# Patient Record
Sex: Female | Born: 1985 | Race: Black or African American | Hispanic: No | Marital: Single | Smoking: Never smoker
Health system: Southern US, Community
[De-identification: ages and names within clinical notes are randomized; demographics above are authoritative.]

## PROBLEM LIST (undated history)

## (undated) DIAGNOSIS — O119 Pre-existing hypertension with pre-eclampsia, unspecified trimester: Secondary | ICD-10-CM

## (undated) DIAGNOSIS — I10 Essential (primary) hypertension: Secondary | ICD-10-CM

## (undated) DIAGNOSIS — O99820 Streptococcus B carrier state complicating pregnancy: Secondary | ICD-10-CM

## (undated) DIAGNOSIS — O1490 Unspecified pre-eclampsia, unspecified trimester: Secondary | ICD-10-CM

## (undated) DIAGNOSIS — O10919 Unspecified pre-existing hypertension complicating pregnancy, unspecified trimester: Secondary | ICD-10-CM

## (undated) HISTORY — DX: Pre-existing hypertension with pre-eclampsia, unspecified trimester: O11.9

## (undated) HISTORY — DX: Streptococcus B carrier state complicating pregnancy: O99.820

## (undated) HISTORY — DX: Unspecified pre-existing hypertension complicating pregnancy, unspecified trimester: O10.919

## (undated) HISTORY — DX: Unspecified pre-eclampsia, unspecified trimester: O14.90

## (undated) HISTORY — DX: Essential (primary) hypertension: I10

## (undated) HISTORY — PX: NO PAST SURGERIES: SHX2092

---

## 2014-04-29 ENCOUNTER — Encounter (HOSPITAL_COMMUNITY): Payer: Self-pay | Admitting: Emergency Medicine

## 2014-04-29 ENCOUNTER — Emergency Department (HOSPITAL_COMMUNITY)
Admission: EM | Admit: 2014-04-29 | Discharge: 2014-04-29 | Disposition: A | Discharge status: Home / Self Care | Payer: Self-pay | Attending: Emergency Medicine | Admitting: Emergency Medicine

## 2014-04-29 DIAGNOSIS — J039 Acute tonsillitis, unspecified: Secondary | ICD-10-CM | POA: Insufficient documentation

## 2014-04-29 DIAGNOSIS — J029 Acute pharyngitis, unspecified: Secondary | ICD-10-CM | POA: Insufficient documentation

## 2014-04-29 DIAGNOSIS — R000 Tachycardia, unspecified: Secondary | ICD-10-CM | POA: Insufficient documentation

## 2014-04-29 LAB — RAPID STREP SCREEN (MED CTR MEBANE ONLY): Streptococcus, Group A Screen (Direct): NEGATIVE

## 2014-04-29 MED ORDER — AMOXICILLIN-POT CLAVULANATE 875-125 MG PO TABS
1.0000 | ORAL_TABLET | Freq: Once | ORAL | Status: AC
  Administered 2014-04-29: 1 via ORAL
  Filled 2014-04-29: qty 1

## 2014-04-29 MED ORDER — AMOXICILLIN-POT CLAVULANATE 875-125 MG PO TABS: 1.0000 | ORAL_TABLET | Freq: Two times a day (BID) | ORAL | Status: DC

## 2014-04-29 MED ORDER — HYDROCODONE-ACETAMINOPHEN 5-325 MG PO TABS
1.0000 | ORAL_TABLET | Freq: Once | ORAL | Status: AC
  Administered 2014-04-29: 1 via ORAL
  Filled 2014-04-29: qty 1

## 2014-04-29 MED ORDER — IBUPROFEN 200 MG PO TABS
600.0000 mg | ORAL_TABLET | Freq: Once | ORAL | Status: AC
  Administered 2014-04-29: 600 mg via ORAL

## 2014-04-29 MED ORDER — HYDROCODONE-ACETAMINOPHEN 5-325 MG PO TABS: 1.0000 | ORAL_TABLET | ORAL | Status: DC | PRN

## 2014-04-29 MED ORDER — ACETAMINOPHEN 325 MG PO TABS
650.0000 mg | ORAL_TABLET | Freq: Four times a day (QID) | ORAL | Status: DC | PRN
  Administered 2014-04-29: 650 mg via ORAL
  Filled 2014-04-29: qty 2

## 2014-04-29 MED ORDER — DEXAMETHASONE 4 MG PO TABS
6.0000 mg | ORAL_TABLET | Freq: Once | ORAL | Status: AC
Start: 2014-04-29 — End: 2014-04-29
  Administered 2014-04-29: 6 mg via ORAL
  Filled 2014-04-29: qty 2

## 2014-04-29 NOTE — ED Provider Notes (Signed)
Medical screening examination/treatment/procedure(s) were performed by non-physician practitioner and as supervising physician I was immediately available for consultation/collaboration.   EKG Interpretation None        Tynisha Ogan, MD 04/29/14 2349 

## 2014-04-29 NOTE — ED Provider Notes (Signed)
CSN: [id number]     Arrival date & time 04/29/14  2124 History   None    Chief Complaint  Patient presents with  . Sore Throat    The patient said she has had throat pain for about two days.  She did say she has "white spots" in the back of her throat.  She cant eat, she cant swallow.       (Consider location/radiation/quality/duration/timing/severity/associated sxs/prior Treatment) HPI Comments: Patient with sore throat, and purulent exudate on tonsils.  For the past 2, days.  Also complains of difficulty swallowing.  Has taken over-the-counter Advil with minimal relief Denies any nausea, vomiting, headache, abdominal pain, dysuria, diarrhea  Patient is a 28 y.o. female presenting with pharyngitis. The history is provided by the patient.  Sore Throat This is a new problem. The current episode started in the past 7 days. The problem occurs constantly. The problem has been unchanged. Associated symptoms include a fever. Pertinent negatives include no headaches, joint swelling, myalgias or rash. The symptoms are aggravated by swallowing. Treatments tried: advil. The treatment provided mild relief.    History reviewed. No pertinent past medical history. History reviewed. No pertinent past surgical history. History reviewed. No pertinent family history. History  Substance Use Topics  . Smoking status: Never Smoker   . Smokeless tobacco: Never Used  . Alcohol Use: Yes     Comment: occ   OB History   Grav Para Term Preterm Abortions TAB SAB Ect Mult Living                 Review of Systems  Constitutional: Positive for fever.  HENT: Positive for trouble swallowing and voice change. Negative for ear discharge and sinus pressure.   Respiratory: Negative for shortness of breath.   Genitourinary: Negative for dysuria.  Musculoskeletal: Negative for joint swelling and myalgias.  Skin: Negative for rash.  Neurological: Negative for headaches.  All other systems reviewed and are  negative.     Allergies  Review of patient's allergies indicates no known allergies.  Home Medications   Prior to Admission medications   Medication Sig Start Date End Date Taking? Authorizing Provider  naproxen sodium (ANAPROX) 220 MG tablet Take 220 mg by mouth daily as needed. For pain   Yes Historical Provider, MD   BP 128/68  Pulse 118  Temp(Src) 101 F (38.3 C) (Oral)  Resp 18  SpO2 100%  LMP 04/23/2014 Physical Exam  Nursing note and vitals reviewed. Constitutional: She appears well-developed and well-nourished.  HENT:  Head: Normocephalic.  Right Ear: External ear normal.  Left Ear: External ear normal.  Mouth/Throat: Uvula is midline. No uvula swelling. No tonsillar abscesses.  Exudate on tonsils, bilateral  Neck: Normal range of motion.  Cardiovascular: Regular rhythm.  Tachycardia present.   Pulmonary/Chest: Effort normal and breath sounds normal.  Abdominal: Soft.  Musculoskeletal: Normal range of motion.  Lymphadenopathy:    She has cervical adenopathy.  Neurological: She is alert.  Skin: Skin is warm. No rash noted.    ED Course  Procedures (including critical care time) Labs Review Labs Reviewed  RAPID STREP SCREEN  CULTURE, GROUP A STREP    Imaging Review No results found.   EKG Interpretation None      MDM   Final diagnoses:  None     Will treat with Augmentin and Vicodan also given a dose of Decadron in the emergency department for swelling.  She's been instructed to take the medication as directed until, completed.  Follow up with Dr. Pollyann Kennedy, ENT, if, needed.  She's also been given a resource list to help her find a primary care physician    Arman Filter, NP 04/29/14 4098  Arman Filter, NP 04/29/14 [phone]

## 2014-04-29 NOTE — Discharge Instructions (Signed)
Your strep test is negative.  You have been started on Augmentin.  Please take this as directed until all tablets have been consumed.  You've also been given Vicodin for pain.  Please do not take Tylenol along with the Vicodin as it already contains Tylenol, you can safely take Advil or ibuprofen in conjunction with the Vicodin.  You've also been given a dose of Decadron, which is a long acting steroid, which will help with swelling.  It also been given a referral to ENT.  Your sore throat.  Does not improve significantly in the last next 7-10 days    Emergency Department Resource Guide 1) Find a Doctor and Pay Out of Pocket Although you won't have to find out who is covered by your insurance plan, it is a good idea to ask around and get recommendations. You will then need to call the office and see if the doctor you have chosen will accept you as a new patient and what types of options they offer for patients who are self-pay. Some doctors offer discounts or will set up payment plans for their patients who do not have insurance, but you will need to ask so you aren't surprised when you get to your appointment.  2) Contact Your Local Health Department Not all health departments have doctors that can see patients for sick visits, but many do, so it is worth a call to see if yours does. If you don't know where your local health department is, you can check in your phone book. The CDC also has a tool to help you locate your state's health department, and many state websites also have listings of all of their local health departments.  3) Find a Walk-in Clinic If your illness is not likely to be very severe or complicated, you may want to try a walk in clinic. These are popping up all over the country in pharmacies, drugstores, and shopping centers. They're usually staffed by nurse practitioners or physician assistants that have been trained to treat common illnesses and complaints. They're usually fairly  quick and inexpensive. However, if you have serious medical issues or chronic medical problems, these are probably not your best option.  No Primary Care Doctor: - Call Health Connect at  [phone] - they can help you locate a primary care doctor that  accepts your insurance, provides certain services, etc. - Physician Referral Service- [phone]  Chronic Pain Problems: Organization         Address  Phone   Notes  [facility]  [phone] Patients need to be referred by their primary care doctor.   Medication Assistance: Organization         Address  Phone   Notes  [facility] Medication [facility] [address] [place] [place], [place] [address] [phone] --Must be a resident of [facility] -- Must have NO insurance coverage whatsoever (no Medicaid/ Medicare, etc.) -- The pt. MUST have a primary care doctor that directs their care regularly and follows them in the community   Med[facility]ist  [phone]   [employer]  [phone]    Agencies that provide inexpensive medical care: Organization         Address  Phone   Notes  [facility]  [phone]   [facility]    [phone]   [facility] [address] [place], [place] [address] [phone]   [facility] [address], [place] [phone]   Planned Parenthood    [phone]   [facility]    [phone]   [facility]  [address], [place] Phone:  [phone], Fax:  [phone] Hours of Operation:  9 am - 6 pm, M-F.  Also accepts Medicaid/Medicare and self-pay.  [facility]  [address], [place] Phone: [phone], Fax: [phone]. Hours of Operation:  8:30 am - 5:30 pm, M-F.  Also accepts Medicaid and self-pay.  [facility] [place] [address] [place] Phone: [phone]    [facility] [address] Natasha Bence [place], [place] [phone], Ext. [phone] Mondays & Thursdays: 7-9 AM.  First 15 patients are seen on a first come, first serve basis.    Medicaid-accepting [facility] Providers:  Organization         Address  Phone   Notes  [facility] [address], [place] [phone] Also accepts self-pay patients.  [facility] [address] Laurell Josephs [place], [place]  [phone]   [facility] [address], [place] [phone]   [facility] [address] [place] [phone]   Renaye Rakers [address], [place]   [phone] Only accepts [place] Access [place] patients after they have their name applied to their card.   Self-Pay (no insurance) in [facility]:  Organization         Address  Phone   Notes  Sickle Cell Patients, [facility] [place] [facility] [address] [place], [place] [phone]   [facility] [address] [place], [place] [phone]   [facility] [place]  [address], [place] [phone]   [facility]/Dr. Osei-Bonsu  [address], [place] or [address], [place] [phone] Phone number for both [place] and [place] locations is the same.  [facility] [address] [place] [phone]   [facility] [address] [place] or [address] [phone]   [facility] [address] [place] [phone], phone; [phone], fax Sees patients 1st and 3rd Saturday of every month.  Must not qualify for public or private insurance (i.e. Medicaid, Medicare, [place] Health Choice, Veterans' Benefits)  Household income should be no more than 200% of the poverty level The clinic cannot treat you if you are  pregnant or think you are pregnant  Sexually transmitted diseases are not treated at the clinic.    Dental Care: Organization         Address  Phone  Notes  [facility] Department of [facility] [address] [place], [place] [phone] Accepts children up to age 64 who are enrolled in [place] or [place] Health Choice; pregnant women with a Medicaid card; and children who have applied for Medicaid or [place] Health Choice, but were declined, whose parents can pay a reduced fee at time of service.  [facility] of [facility]  [address], [place] [phone] Accepts children up to age 72 who are enrolled in [place] or  Health Choice; pregnant women with a Medicaid card; and children who  have applied for Medicaid or [place] Health Choice, but were declined, whose parents can pay a reduced fee at time of service.  [facility]  [address] [place], [place] [phone] Patients are seen by appointment only. Walk-ins are not accepted. [facility] will see patients 62 years of age and older. Monday - Tuesday (8am-5pm) Most Wednesdays (8:30-5pm) $30 per visit, cash only  [facility] Adult Dental Access PRO[facility]  [address], [facility] [phone] Patients are seen by appointment only. Walk-ins are not accepted. [facility] will see patients 80 years of age and older. One Wednesday Evening (Monthly: Volunteer Based).  $30 per visit, cash only  [employer]  [phone] for adults; Children under age 45, call Graduate Pediatric Dentistry at [phone]. Children aged 41-14, please call [phone] to request a pediatric application.  Dental services are provided in all areas of dental care including fillings, crowns and bridges, complete and partial dentures, implants, gum treatment, root canals, and extractions. Preventive care is also provided. Treatment is provided to  both adults and children. Patients are selected via a lottery and there is often a waiting list.   [facility] [address], [place]  [phone] [email or web address]   [facility] [address] [place], [place] [phone], Ext. [phone] Second and Fourth Thursday of each month, opens at 6:30 AM; Clinic ends at 9 AM.  Patients are seen on a first-come first-served basis, and a limited number are seen during each clinic.   [facility]  [address] Ether Griffins [place], [place] [phone]   Eligibility Requirements You must have lived in [place], [place], or [place] counties for at least the last three months.   You cannot be eligible for state or federal sponsored [place], including [employer], [place], or [employer].   You generally cannot be eligible for healthcare insurance through your employer.    How to apply: Eligibility screenings are held every Tuesday and Wednesday afternoon from 1:00 pm until 4:00 pm. You do not need an appointment for the interview!  [facility] [address] [place], [place] [phone]   [facility]  [phone]   [facility] Health Department  [phone]   [facility] Health Department  [phone]    Behavioral Health Resources in the Community: Intensive Outpatient Programs Organization         Address  Phone  Notes  [facility] [address], [place], [place] [phone]   [facility] [address] [place], [place] [phone]   [facility] [address] [place], [place]  [phone]   [facility] Mental Health [address],  [place], [place] [phone] or [phone]   Substance Abuse Resources Organization         Address  Phone  Notes  [facility] Drug Services  [phone]   [facility]  [phone]   [facility] [place]   [phone]   Floydene Flock  [phone]   Residential & Outpatient Substance Abuse Program  [phone]   Psychological Services Organization         Address  Phone  Notes  [facility] Health  [phone]   [facility] Services  [phone]   [facility] Mental Health [address] [place] [phone] or [phone]    Mobile Crisis Teams Organization  Address  Phone  Notes  [facility], Mobile Crisis Care Unit  [phone]   [facility]  [address] [place], [place] [phone]   [facility] [address] [place] [phone]    Self-Help/Support Groups Organization         Address  Phone             Notes  [facility] - variety of support groups  336- I7437963 Call for more information  Narcotics Anonymous (NA), [facility] [address], [employer] [place]  2 meetings at this location   Statistician         Address  Phone  Notes  [facility] [address],    [place]  [phone]   [facility]  [address], [place] [address], [place], [place] [phone]   [facility] [address] [place], [place] [phone] Admissions: 8am-3pm M-F  [facility] [address],    [place], [place] [phone]   [facility] [address] [place], [place], [place] [phone]   [facility] [address]  [place], [place] [phone]   [facility] - Intensive Outpatient [address]ure[address], [place], [place] [phone]   [facility] ([facility] [address] [place]  [place], [place] [phone] or [phone]   [facility] ([facility]) [address], [place], [place] [phone] Accepts Medicaid  [facility] [address]  [place] [phone] Substance Abuse/Addiction Treatment   [facility] Organization         Address  Phone  Notes  [facility]  [phone]   Angie Fava, PhD [address] Ervin Knack [place], [place]   [phone] or [phone]   [facility]   [address] [place], [place] [phone]   [facility] [address] [place], [place] [phone] Insurance/Medicaid/sponsorship through [facility] [address]                                    [place], [place] [phone] Therapy/tele-psych/case  [facility] [address], [place] [phone]    Dr. Lolly Mustache  [phone]   [facility]  [employer] [facility] Dept. 1) [address], [place] 2) [address] [place] 3)  [address], [place] [phone]  [phone]   [facility] Child Abuse Hotline (612)[phone] or [phone] (After Hours)

## 2014-04-29 NOTE — ED Notes (Signed)
The patient said she has had throat pain for about two days.  She did say she has "white spots" in the back of her throat.  She cant eat, she cant swallow.  The patient rates her pain 10/10.

## 2014-05-01 LAB — CULTURE, GROUP A STREP

## 2016-03-21 ENCOUNTER — Encounter (HOSPITAL_COMMUNITY): Payer: Self-pay | Admitting: Emergency Medicine

## 2016-03-21 ENCOUNTER — Emergency Department (HOSPITAL_COMMUNITY)
Admission: EM | Admit: 2016-03-21 | Discharge: 2016-03-21 | Disposition: A | Discharge status: Home / Self Care | Payer: Self-pay | Attending: Emergency Medicine | Admitting: Emergency Medicine

## 2016-03-21 DIAGNOSIS — Z792 Long term (current) use of antibiotics: Secondary | ICD-10-CM | POA: Insufficient documentation

## 2016-03-21 DIAGNOSIS — K029 Dental caries, unspecified: Secondary | ICD-10-CM | POA: Insufficient documentation

## 2016-03-21 DIAGNOSIS — K0889 Other specified disorders of teeth and supporting structures: Secondary | ICD-10-CM | POA: Insufficient documentation

## 2016-03-21 MED ORDER — BUPIVACAINE-EPINEPHRINE (PF) 0.5% -1:200000 IJ SOLN
1.8000 mL | Freq: Once | INTRAMUSCULAR | Status: AC
  Administered 2016-03-21: 1.8 mL
  Filled 2016-03-21: qty 1.8

## 2016-03-21 MED ORDER — PENICILLIN V POTASSIUM 500 MG PO TABS: 500.0000 mg | ORAL_TABLET | Freq: Four times a day (QID) | ORAL | Status: AC

## 2016-03-21 MED ORDER — KETOROLAC TROMETHAMINE 60 MG/2ML IM SOLN
60.0000 mg | Freq: Once | INTRAMUSCULAR | Status: AC
  Administered 2016-03-21: 60 mg via INTRAMUSCULAR
  Filled 2016-03-21: qty 2

## 2016-03-21 MED ORDER — NAPROXEN 500 MG PO TABS: 500.0000 mg | ORAL_TABLET | Freq: Two times a day (BID) | ORAL | Status: DC

## 2016-03-21 MED ORDER — LIDOCAINE VISCOUS 2 % MT SOLN: 15.0000 mL | OROMUCOSAL | Status: DC | PRN

## 2016-03-21 MED ORDER — OXYCODONE-ACETAMINOPHEN 5-325 MG PO TABS
1.0000 | ORAL_TABLET | Freq: Once | ORAL | Status: AC
  Administered 2016-03-21: 1 via ORAL
  Filled 2016-03-21: qty 1

## 2016-03-21 NOTE — ED Notes (Signed)
Pt given discharge instructions, verbalized understanding of need to follow up with the dentist, reasons to return to the ED and medications to take at home. Pt denied questions or further concerns. Pt able to ambulate to exit where family member was waiting. Pt stated pain decreased to 4/10.

## 2016-03-21 NOTE — ED Provider Notes (Signed)
CSN: [id number]     Arrival date & time 03/21/16  1620 History  By signing my name below, I, Laura Mcneil, attest that this documentation has been prepared under the direction and in the presence of Tasmin Exantus, PA-C.Marland Kitchen Electronically Signed: Bridgette Mcneil, ED Scribe. 03/21/2016. 6:23 PM.   Chief Complaint  Patient presents with  . Dental Pain   The history is provided by the patient. No language interpreter was used.   HPI Comments: Laura Mcneil is a 30 y.o. female who presents to the Emergency Department complaining of sudden onset, constant, moderate lower right dental pain onset 2 days ago. No drainage present. Pt notes some swelling present in her gums and face onset one day ago with difficulty chewing. Pt also has intermittent headache secondary to the pain. Pt has not been able to see a dentist yet. Pt denies nausea, vomiting, fever/chills, difficulty breathing or swallowing, or any other complaints.   History reviewed. No pertinent past medical history. History reviewed. No pertinent past surgical history. No family history on file. Social History  Substance Use Topics  . Smoking status: Never Smoker   . Smokeless tobacco: Never Used  . Alcohol Use: Yes     Comment: occ   OB History    No data available     Review of Systems  Constitutional: Negative for fever.  HENT: Positive for dental problem and facial swelling.   Gastrointestinal: Negative for nausea, vomiting and diarrhea.  Neurological: Positive for headaches. Negative for dizziness and numbness.    Allergies  Review of patient's allergies indicates no known allergies.  Home Medications   Prior to Admission medications   Medication Sig Start Date End Date Taking? Authorizing Provider  amoxicillin-clavulanate (AUGMENTIN) 875-125 MG per tablet Take 1 tablet by mouth 2 (two) times daily. 04/29/14   Earley Favor, NP  HYDROcodone-acetaminophen (NORCO/VICODIN) 5-325 MG per tablet Take 1 tablet by mouth every 4 (four) hours as  needed for moderate pain. 04/29/14   Earley Favor, NP  lidocaine (XYLOCAINE) 2 % solution Use as directed 15 mLs in the mouth or throat as needed for mouth pain. 03/21/16   Trea Latner C Hernando Reali, PA-C  naproxen (NAPROSYN) 500 MG tablet Take 1 tablet (500 mg total) by mouth 2 (two) times daily. 03/21/16   Corday Wyka C Breanda Greenlaw, PA-C  naproxen sodium (ANAPROX) 220 MG tablet Take 220 mg by mouth daily as needed. For pain    Historical Provider, MD  penicillin v potassium (VEETID) 500 MG tablet Take 1 tablet (500 mg total) by mouth 4 (four) times daily. 03/21/16 03/28/16  Maygan Koeller C Delayna Sparlin, PA-C   BP 172/104 mmHg  Pulse 86  Temp(Src) 98.7 F (37.1 C) (Oral)  Resp 18  Ht  (1.803 m)  Wt 117.935 kg  BMI 36.28 kg/m2  SpO2 98%  LMP 03/12/2016 Physical Exam  Constitutional: She appears well-developed and well-nourished.  HENT:  Head: Normocephalic.  Minor facial swelling to right lower jaw. Dental carries with a partially fractured tooth in the second right mandibular premolar. Swelling to the gingival surface without fluctuance. No hemorrhage or exudate.  Eyes: Conjunctivae are normal.  Neck: Normal range of motion.  No swelling to the soft tissues of the neck.  Cardiovascular: Normal rate.   Pulmonary/Chest: Effort normal. No respiratory distress.  Neurological: She is alert.  Skin: Skin is warm and dry.  Psychiatric: She has a normal mood and affect. Her behavior is normal.  Nursing note and vitals reviewed.   ED Course  .Nerve  Block Date/Time: 03/21/2016 6:45 PM Performed by: Anselm PancoastJOY, Niva Murren C Authorized by: Anselm PancoastJOY, Johnpatrick Jenny C Consent: Verbal consent obtained. Risks and benefits: risks, benefits and alternatives were discussed Consent given by: patient Patient understanding: patient states understanding of the procedure being performed Patient consent: the patient's understanding of the procedure matches consent given Procedure consent: procedure consent matches procedure scheduled Patient identity confirmed:  verbally with patient and arm band Indications: pain relief Body area: face/mouth Nerve: inferior alveolar Laterality: right Patient sedated: no Patient position: sitting Needle gauge: 27 G Location technique: anatomical landmarks Local anesthetic: bupivacaine 0.5% with epinephrine Anesthetic total: 1.8 ml Outcome: pain improved Patient tolerance: Patient tolerated the procedure well with no immediate complications    DIAGNOSTIC STUDIES: Oxygen Saturation is 100% on RA, normal by my interpretation.    COORDINATION OF CARE: 6:23 PM Discussed treatment plan with pt at bedside which includes antibiotics and pain Rx and dentist follow-up and pt agreed to plan.  MDM   Final diagnoses:  Pain due to dental caries    Donavan FoilAnitra Hammonds presents with dental pain for the past two days.    Pt with dental pain, likely from dental caries. Exam not concerning for ludwig's angioedema. No signs of sepsis. Pt to follow up with dentist. The patient was given instructions for home care as well as return precautions. Patient voices understanding of these instructions, accepts the plan, and is comfortable with discharge.  Filed Vitals:   03/21/16 1701 03/21/16 1723 03/21/16 1934  BP: 172/104    Pulse: 82  86  Temp: 98.7 F (37.1 C)    TempSrc: Oral    Resp: 18  18  Height: 5\' 11"  (1.803 m)    Weight: 117.935 kg    SpO2: 100% 100% 98%     I personally performed the services described in this documentation, which was scribed in my presence. The recorded information has been reviewed and is accurate.   Anselm PancoastShawn C Glover Capano, PA-C 03/22/16 1535   Cathren LaineKevin Steinl, MD 03/27/16 1311

## 2016-03-21 NOTE — Discharge Instructions (Signed)
You have been seen today for dental pain. Follow up with a dentist as soon as possible. Please take all of your antibiotics until finished!   You may develop abdominal discomfort or diarrhea from the antibiotic.  You may help offset this with probiotics which you can buy or get in yogurt. Do not eat or take the probiotics until 2 hours after your antibiotic. Return to ED as needed. Naproxen or ibuprofen for pain. We are unable to prescribe narcotic pain medications for dental pain.

## 2016-03-21 NOTE — ED Notes (Signed)
Per patient, she has lower right dental pain.  Some swelling in her gums.  She is having difficulty chewing. Denies n/v/d; however, she is beginning to have a headache from the pain.

## 2016-09-03 NOTE — L&D Delivery Note (Signed)
Delivery Note At 3:32 AM a viable female was delivered via  (Presentation: ROA).  APGAR:8 ,9 ; weight: pending  .   Placenta status: Spontaneous and Intact.  Cord: 3 vessels  with the following complications: none.  Cord pH: n/a  Anesthesia:  Epidural  Episiotomy: None Lacerations: Periurethral and Left labial Suture Repair: none, lacerations found to have hemostasis Est. Blood Loss (mL):  100ml   Baby was delivered via vaginally without complications. Baby presented as ROA and was immediately placed on mothers chest. Cord clamping was then delayed and clamped x2. FOB cut cord. Umbilical blood samples were taken. Cord had 3 vessels. Placenta was then delivered spontaneously with traction and was intact. Placenta was sent to pathology. Baby was taken to NICU.   Mom to postpartum.  Baby to NICU.  Tyeler Goedken 08/15/2017, 3:50 AM

## 2017-02-04 ENCOUNTER — Encounter (HOSPITAL_COMMUNITY): Payer: Self-pay | Admitting: Emergency Medicine

## 2017-02-04 ENCOUNTER — Emergency Department (HOSPITAL_COMMUNITY)
Admission: EM | Admit: 2017-02-04 | Discharge: 2017-02-04 | Disposition: A | Discharge status: Home / Self Care | Payer: Medicaid Other | Attending: Emergency Medicine | Admitting: Emergency Medicine

## 2017-02-04 DIAGNOSIS — O10011 Pre-existing essential hypertension complicating pregnancy, first trimester: Secondary | ICD-10-CM | POA: Insufficient documentation

## 2017-02-04 DIAGNOSIS — O0281 Inappropriate change in quantitative human chorionic gonadotropin (hCG) in early pregnancy: Secondary | ICD-10-CM | POA: Diagnosis not present

## 2017-02-04 DIAGNOSIS — Z79899 Other long term (current) drug therapy: Secondary | ICD-10-CM | POA: Diagnosis not present

## 2017-02-04 DIAGNOSIS — Z3A01 Less than 8 weeks gestation of pregnancy: Secondary | ICD-10-CM | POA: Diagnosis not present

## 2017-02-04 DIAGNOSIS — O9989 Other specified diseases and conditions complicating pregnancy, childbirth and the puerperium: Secondary | ICD-10-CM | POA: Diagnosis present

## 2017-02-04 DIAGNOSIS — O161 Unspecified maternal hypertension, first trimester: Secondary | ICD-10-CM

## 2017-02-04 DIAGNOSIS — O99351 Diseases of the nervous system complicating pregnancy, first trimester: Secondary | ICD-10-CM | POA: Diagnosis not present

## 2017-02-04 DIAGNOSIS — G43809 Other migraine, not intractable, without status migrainosus: Secondary | ICD-10-CM

## 2017-02-04 LAB — CBC WITH DIFFERENTIAL/PLATELET
BASOS ABS: 0 10*3/uL (ref 0.0–0.1)
BASOS PCT: 0 %
Eosinophils Absolute: 0 10*3/uL (ref 0.0–0.7)
Eosinophils Relative: 0 %
HCT: 30.2 % — ABNORMAL LOW (ref 36.0–46.0)
HEMOGLOBIN: 9.7 g/dL — AB (ref 12.0–15.0)
Lymphocytes Relative: 35 %
Lymphs Abs: 1.7 10*3/uL (ref 0.7–4.0)
MCH: 24.3 pg — ABNORMAL LOW (ref 26.0–34.0)
MCHC: 32.1 g/dL (ref 30.0–36.0)
MCV: 75.7 fL — ABNORMAL LOW (ref 78.0–100.0)
Monocytes Absolute: 0.4 10*3/uL (ref 0.1–1.0)
Monocytes Relative: 7 %
NEUTROS ABS: 2.9 10*3/uL (ref 1.7–7.7)
NEUTROS PCT: 58 %
Platelets: 226 10*3/uL (ref 150–400)
RBC: 3.99 MIL/uL (ref 3.87–5.11)
RDW: 16.6 % — ABNORMAL HIGH (ref 11.5–15.5)
WBC: 5 10*3/uL (ref 4.0–10.5)

## 2017-02-04 LAB — BASIC METABOLIC PANEL
ANION GAP: 8 (ref 5–15)
BUN: 9 mg/dL (ref 6–20)
CALCIUM: 9.3 mg/dL (ref 8.9–10.3)
CHLORIDE: 106 mmol/L (ref 101–111)
CO2: 23 mmol/L (ref 22–32)
Creatinine, Ser: 0.86 mg/dL (ref 0.44–1.00)
GFR calc non Af Amer: >60 mL/min (ref >60)
Glucose, Bld: 87 mg/dL (ref 65–99)
Potassium: 3.5 mmol/L (ref 3.5–5.1)
Sodium: 137 mmol/L (ref 135–145)

## 2017-02-04 LAB — HEPATIC FUNCTION PANEL
ALT: 10 U/L — ABNORMAL LOW (ref 14–54)
AST: 13 U/L — AB (ref 15–41)
Albumin: 4.3 g/dL (ref 3.5–5.0)
Alkaline Phosphatase: 41 U/L (ref 38–126)
Bilirubin, Direct: <0.1 mg/dL — ABNORMAL LOW (ref 0.1–0.5)
TOTAL PROTEIN: 8.1 g/dL (ref 6.5–8.1)
Total Bilirubin: 0.4 mg/dL (ref 0.3–1.2)

## 2017-02-04 LAB — HCG, QUANTITATIVE, PREGNANCY: hCG, Beta Chain, Quant, S: 84715 m[IU]/mL — ABNORMAL HIGH (ref <5)

## 2017-02-04 LAB — I-STAT BETA HCG BLOOD, ED (MC, WL, AP ONLY): I-stat hCG, quantitative: >2000 m[IU]/mL — ABNORMAL HIGH (ref <5)

## 2017-02-04 LAB — ABO/RH: ABO/RH(D): B POS

## 2017-02-04 MED ORDER — SODIUM CHLORIDE 0.9 % IV BOLUS (SEPSIS)
1000.0000 mL | Freq: Once | INTRAVENOUS | Status: AC
  Administered 2017-02-04: 1000 mL via INTRAVENOUS

## 2017-02-04 MED ORDER — ACETAMINOPHEN 500 MG PO TABS
1000.0000 mg | ORAL_TABLET | Freq: Once | ORAL | Status: AC
  Administered 2017-02-04: 1000 mg via ORAL
  Filled 2017-02-04: qty 2

## 2017-02-04 MED ORDER — KETOROLAC TROMETHAMINE 30 MG/ML IJ SOLN
30.0000 mg | Freq: Once | INTRAMUSCULAR | Status: DC
  Filled 2017-02-04: qty 1

## 2017-02-04 MED ORDER — LABETALOL HCL 100 MG PO TABS
100.0000 mg | ORAL_TABLET | Freq: Once | ORAL | Status: AC
  Administered 2017-02-04: 100 mg via ORAL
  Filled 2017-02-04: qty 1

## 2017-02-04 MED ORDER — PRENATAL COMPLETE 14-0.4 MG PO TABS: 1.0000 | ORAL_TABLET | Freq: Every day | ORAL | 0 refills | Status: DC

## 2017-02-04 MED ORDER — FERROUS SULFATE 325 (65 FE) MG PO TABS: 325.0000 mg | ORAL_TABLET | Freq: Every day | ORAL | 0 refills | Status: DC

## 2017-02-04 MED ORDER — LABETALOL HCL 100 MG PO TABS: 100.0000 mg | ORAL_TABLET | Freq: Two times a day (BID) | ORAL | 0 refills | Status: DC

## 2017-02-04 MED ORDER — DIPHENHYDRAMINE HCL 50 MG/ML IJ SOLN
25.0000 mg | Freq: Once | INTRAMUSCULAR | Status: DC
  Filled 2017-02-04: qty 1

## 2017-02-04 MED ORDER — PROCHLORPERAZINE EDISYLATE 5 MG/ML IJ SOLN
10.0000 mg | Freq: Once | INTRAMUSCULAR | Status: DC
  Filled 2017-02-04: qty 2

## 2017-02-04 NOTE — ED Triage Notes (Signed)
Per pt, states she has had a headache for 3 days-no history and has not f/u with anyone

## 2017-02-04 NOTE — ED Notes (Signed)
Pt A&Ox4 and ambulatory independent w/steady gait.  Given info about managing htn in pregnancy.  Denies any further questions.

## 2017-02-04 NOTE — Discharge Instructions (Signed)
Take your medication as prescribed for your elevated blood pressure. As recommended taking your iron supplement and prenatal vitamins as prescribed. You may take Tylenol as prescribed over-the-counter as needed for your headache. Call the OB/GYN clinic listed below tomorrow to schedule a follow-up appointment for reevaluation and further management of your pregnancy and elevated blood pressure. Please return to the Emergency Department if symptoms worsen or new onset of fever, new/worsening headache, visual changes, dizziness, chest pain, difficulty breathing, abdominal pain, vomiting, vaginal bleeding/discharge.

## 2017-02-04 NOTE — ED Provider Notes (Signed)
[facility]-EMERGENCY DEPT Provider Note   CSN: [id number] Arrival date & time: 02/04/17  1727     History   Chief Complaint Chief Complaint  Patient presents with  . Headache    HPI Laura Mcneil is a 31 y.o. female.  HPI   Patient is a 31 year old female with no pertinent history who presents to the ED with complaint of headache, onset 3 days. Patient reports she has had gradually worsening waxing and waning frontal headache for the past 3 days. Endorses associated intermittent dizziness, light sensitivity, nausea. Reports having similar migraines in the past but notes she has not had a migraine over the past few years. Reports taking Excedrin at home with mild intermittent relief. Denies any recent fall or head injury. Pt denies fever, neck stiffness, visual changes, CP, SOB, abdominal pain, N/V, urinary symptoms, numbness, tingling, weakness, seizures, syncope. rash.    Patient also reports skipping her menstrual cycle last month and is requesting pregnancy test. Denies fever, abdominal pain, nausea, vomiting, vaginal bleeding or discharge. LMP 12/12/15.  History reviewed. No pertinent past medical history.  There are no active problems to display for this patient.   History reviewed. No pertinent surgical history.  OB History    No data available       Home Medications    Prior to Admission medications   Medication Sig Start Date End Date Taking? Authorizing Provider  Diphenhydramine-APAP, sleep, (EXCEDRIN PM) 38-500 MG TABS Take 2 tablets by mouth daily as needed.   Yes [provider]  ferrous sulfate 325 (65 FE) MG tablet Take 1 tablet (325 mg total) by mouth daily. 02/04/17   Barrett Henle, PA-C  labetalol (NORMODYNE) 100 MG tablet Take 1 tablet (100 mg total) by mouth 2 (two) times daily. 02/04/17   Barrett Henle, PA-C  Prenatal Vit-Fe Fumarate-FA (PRENATAL COMPLETE) 14-0.4 MG TABS Take 1 tablet by mouth daily. 02/04/17   Barrett Henle,  PA-C    Family History No family history on file.  Social History Social History  Substance Use Topics  . Smoking status: Never Smoker  . Smokeless tobacco: Never Used  . Alcohol use Yes     Comment: occ     Allergies   Patient has no known allergies.   Review of Systems Review of Systems  Eyes: Positive for photophobia.  Gastrointestinal: Positive for nausea.  Neurological: Positive for dizziness and headaches.  All other systems reviewed and are negative.    Physical Exam Updated Vital Signs BP (!) 161/98   Pulse 67   Temp 98.4 F (36.9 C) (Oral)   Resp 18   Ht 5\' 11"  (1.803 m)   Wt 117.9 kg (260 lb)   LMP 12/31/2016   SpO2 100%   BMI 36.26 kg/m   Physical Exam  Constitutional: She is oriented to person, place, and time. She appears well-developed and well-nourished. No distress.  HENT:  Head: Normocephalic and atraumatic.  Right Ear: Tympanic membrane normal.  Left Ear: Tympanic membrane normal.  Nose: Nose normal. Right sinus exhibits no maxillary sinus tenderness and no frontal sinus tenderness. Left sinus exhibits no maxillary sinus tenderness and no frontal sinus tenderness.  Mouth/Throat: Uvula is midline, oropharynx is clear and moist and mucous membranes are normal. No oropharyngeal exudate, posterior oropharyngeal edema, posterior oropharyngeal erythema or tonsillar abscesses. No tonsillar exudate.  Eyes: Conjunctivae and EOM are normal. Pupils are equal, round, and reactive to light. Right eye exhibits no discharge. Left eye exhibits no discharge. No scleral  icterus.  No nystagmus  Neck: Normal range of motion and full passive range of motion without pain. Neck supple. No spinous process tenderness and no muscular tenderness present. No neck rigidity. No edema, no erythema and normal range of motion present.  Cardiovascular: Normal rate, regular rhythm, normal heart sounds and intact distal pulses.   Pulmonary/Chest: Effort normal and breath sounds  normal. No respiratory distress. She has no wheezes. She has no rales. She exhibits no tenderness.  Abdominal: Soft. Bowel sounds are normal. She exhibits no distension and no mass. There is no tenderness. There is no rebound and no guarding.  Musculoskeletal: Normal range of motion. She exhibits no edema or tenderness.  Lymphadenopathy:    She has no cervical adenopathy.  Neurological: She is alert and oriented to person, place, and time. She has normal strength. No cranial nerve deficit or sensory deficit. She displays a negative Romberg sign. Coordination and gait normal.  Skin: Skin is warm and dry. She is not diaphoretic.  Nursing note and vitals reviewed.    ED Treatments / Results  Labs (all labs ordered are listed, but only abnormal results are displayed) Labs Reviewed  CBC WITH DIFFERENTIAL/PLATELET - Abnormal; Notable for the following:       Result Value   Hemoglobin 9.7 (*)    HCT 30.2 (*)    MCV 75.7 (*)    MCH 24.3 (*)    RDW 16.6 (*)    All other components within normal limits  HEPATIC FUNCTION PANEL - Abnormal; Notable for the following:    AST 13 (*)    ALT 10 (*)    Bilirubin, Direct <0.1 (*)    All other components within normal limits  HCG, QUANTITATIVE, PREGNANCY - Abnormal; Notable for the following:    hCG, Beta Chain, Quant, S [id number] (*)    All other components within normal limits  I-STAT BETA HCG BLOOD, ED ([facility], [facility], [facility] ONLY) - Abnormal; Notable for the following:    I-stat hCG, quantitative >2,000.0 (*)    All other components within normal limits  BASIC METABOLIC PANEL  ABO/RH    EKG  EKG Interpretation None       Radiology No results found.  Procedures Procedures (including critical care time)  Medications Ordered in ED Medications  sodium chloride 0.9 % bolus 1,000 mL (0 mLs Intravenous Stopped 02/04/17 2219)  acetaminophen (TYLENOL) tablet 1,000 mg (1,000 mg Oral Given 02/04/17 1942)  labetalol (NORMODYNE) tablet 100 mg (100 mg Oral  Given 02/04/17 2218)     Initial Impression / Assessment and Plan / ED Course  I have reviewed the triage vital signs and the nursing notes.  Pertinent labs & imaging results that were available during my care of the patient were reviewed by me and considered in my medical decision making (see chart for details).    Patient presents with gradual onset waxing and waning headache that has been present for the past 3 days. She also reports missing her menstrual cycle last month and is requesting a pregnancy test. Denies fever, abdominal pain, nausea, vomiting, vaginal bleeding or discharge. Initial vitals showed BP 162/99. Exam unremarkable. No neuro deficits. Abdomen soft and nontender. Positive pregnancy test. Hgb 9.7. On reevaluation pt denies abdominal pain, vaginal bleeding or d/c. G1P0A0. Pt given Tylenol in the ED.   On reevaluation patient reports resolution of headache. Labs showed hemoglobin 9.7, MCV 75. Normal LFTs. HCG 69,629. Discussed pt with Dr. Clarene Duke. Plan to consult OBGYN for tx of HTN  during first trimester and set up outpatient follow up. Dr. Vergie LivingPickens advised to start patient on 100 mg labetalol twice a day and have the patient call their clinic tomorrow morning to set up follow-up appointment. Discussed results and plan for discharge with patient. Plan discharge patient home with prescription of labetalol, iron supplement and prenatal vitamins. Discussed strict return precautions with patient. Patient has remained hemodynamically stable in the ED prior to discharge.   Final Clinical Impressions(s) / ED Diagnoses   Final diagnoses:  Less than [redacted] weeks gestation of pregnancy  Hypertension during pregnancy in first trimester, unspecified hypertension in pregnancy type  Other migraine without status migrainosus, not intractable    New Prescriptions Discharge Medication List as of 02/04/2017  9:35 PM    START taking these medications   Details  ferrous sulfate 325 (65 FE) MG tablet  Take 1 tablet (325 mg total) by mouth daily., Starting Mon 02/04/2017, Print    labetalol (NORMODYNE) 100 MG tablet Take 1 tablet (100 mg total) by mouth 2 (two) times daily., Starting Mon 02/04/2017, Print    Prenatal Vit-Fe Fumarate-FA (PRENATAL COMPLETE) 14-0.4 MG TABS Take 1 tablet by mouth daily., Starting Mon 02/04/2017, Print         Barrett Henleadeau, Nicole Elizabeth, PA-C 02/04/17 2259    Little, Ambrose Finlandachel Morgan, MD 02/06/17 [id number]

## 2017-02-21 ENCOUNTER — Encounter: Payer: Self-pay | Admitting: Obstetrics and Gynecology

## 2017-03-14 ENCOUNTER — Telehealth: Payer: Self-pay | Admitting: Family Medicine

## 2017-03-14 NOTE — Telephone Encounter (Signed)
Patient will be New OB 08/01, and would like to get a call to speak to someone about getting more medication before her appointment.

## 2017-04-02 NOTE — Telephone Encounter (Signed)
I called Laura Mcneil back and apologized for the delay in calling her back. She states she had ran out of her labetolol and iron. We discussed her appointment is in am and will just have her keep appointment and recheck her bp and see if doctor needs to adjust or change meds. She voices understanding.

## 2017-04-03 ENCOUNTER — Other Ambulatory Visit (HOSPITAL_COMMUNITY)
Admission: RE | Admit: 2017-04-03 | Discharge: 2017-04-03 | Disposition: A | Discharge status: Home / Self Care | Payer: Medicaid Other | Source: Ambulatory Visit | Attending: Obstetrics and Gynecology | Admitting: Obstetrics and Gynecology

## 2017-04-03 ENCOUNTER — Encounter: Payer: Self-pay | Admitting: Obstetrics and Gynecology

## 2017-04-03 ENCOUNTER — Ambulatory Visit (INDEPENDENT_AMBULATORY_CARE_PROVIDER_SITE_OTHER): Payer: Medicaid Other | Admitting: Obstetrics and Gynecology

## 2017-04-03 DIAGNOSIS — O10919 Unspecified pre-existing hypertension complicating pregnancy, unspecified trimester: Secondary | ICD-10-CM | POA: Diagnosis not present

## 2017-04-03 DIAGNOSIS — O10912 Unspecified pre-existing hypertension complicating pregnancy, second trimester: Secondary | ICD-10-CM

## 2017-04-03 DIAGNOSIS — O0992 Supervision of high risk pregnancy, unspecified, second trimester: Secondary | ICD-10-CM | POA: Insufficient documentation

## 2017-04-03 DIAGNOSIS — Z3A16 16 weeks gestation of pregnancy: Secondary | ICD-10-CM | POA: Insufficient documentation

## 2017-04-03 DIAGNOSIS — O0990 Supervision of high risk pregnancy, unspecified, unspecified trimester: Secondary | ICD-10-CM | POA: Insufficient documentation

## 2017-04-03 HISTORY — DX: Unspecified pre-existing hypertension complicating pregnancy, unspecified trimester: O10.919

## 2017-04-03 LAB — POCT URINALYSIS DIP (DEVICE)
Bilirubin Urine: NEGATIVE
GLUCOSE, UA: NEGATIVE mg/dL
Hgb urine dipstick: NEGATIVE
Nitrite: NEGATIVE
Protein, ur: 30 mg/dL — AB
Specific Gravity, Urine: 1.015 (ref 1.005–1.030)
UROBILINOGEN UA: 0.2 mg/dL (ref 0.0–1.0)
pH: 6 (ref 5.0–8.0)

## 2017-04-03 MED ORDER — PRENATAL COMPLETE 14-0.4 MG PO TABS: 1.0000 | ORAL_TABLET | Freq: Every day | ORAL | 6 refills | Status: DC

## 2017-04-03 MED ORDER — ASPIRIN EC 81 MG PO TBEC: 81.0000 mg | DELAYED_RELEASE_TABLET | Freq: Every day | ORAL | 2 refills | Status: DC

## 2017-04-03 MED ORDER — LABETALOL HCL 200 MG PO TABS: 200.0000 mg | ORAL_TABLET | Freq: Two times a day (BID) | ORAL | 3 refills | Status: DC

## 2017-04-03 NOTE — Patient Instructions (Signed)

## 2017-04-04 NOTE — Progress Notes (Signed)
Subjective:  Laura Mcneil is a 31 y.o. G2P0010 at 68w2dbeing seen today for first OB visit. She is uncertain about her LMP. She has a H/O CHTN, but no meds prior to pregnancy. Pt was started on Labetalol after an ER visit, but has ran out. She is currently monitored for the following issues for this high-risk pregnancy and has Chronic hypertension affecting pregnancy and Supervision of high-risk pregnancy on her problem list.  Patient reports no complaints.  Contractions: Not present. Vag. Bleeding: None.  Movement: Absent. Denies leaking of fluid.   The following portions of the patient's history were reviewed and updated as appropriate: allergies, current medications, past family history, past medical history, past social history, past surgical history and problem list. Problem list updated.  Objective:   Vitals:   04/03/17 0923  BP: (!) 142/98  Pulse: 72  Weight: 252 lb 1.6 oz (114.4 kg)    Fetal Status: Fetal Heart Rate (bpm): 151   Movement: Absent     General:  Alert, oriented and cooperative. Patient is in no acute distress.  Skin: Skin is warm and dry. No rash noted.   Cardiovascular: Normal heart rate noted  Respiratory: Normal respiratory effort, no problems with respiration noted  Abdomen: Soft, gravid, appropriate for gestational age. Pain/Pressure: Absent     Pelvic:  Cervical exam performed        Extremities: Normal range of motion.  Edema: None  Mental Status: Normal mood and affect. Normal behavior. Normal judgment and thought content.   Urinalysis:      Assessment and Plan:  Pregnancy: G2P0010 at 1108w2d1. Chronic hypertension affecting pregnancy CHTN and pregnancy reviewed with pt. Serial U/S and antenatal testing discussed Will increase Labetalol and start BASA. Indications for meds reviewed with pt  - Hemoglobinopathy evaluation - Hemoglobin A1c - Cystic Fibrosis Mutation 97 - Obstetric Panel, Including HIV - Culture, OB Urine - [place] OB DETAIL +14  [facility]; Future - Protein / creatinine ratio, urine - Comp Met (CMET) - labetalol (NORMODYNE) 200 MG tablet; Take 1 tablet (200 mg total) by mouth 2 (two) times daily.  Dispense: 60 tablet; Refill: 3 - aspirin EC 81 MG tablet; Take 1 tablet (81 mg total) by mouth daily. Take after 12 weeks for prevention of preeclampsia later in pregnancy  Dispense: 300 tablet; Refill: 2  2. Supervision of high risk pregnancy in second trimester Prenatal care and labs reviewed with pt - POCT urinalysis dip (device) - Hemoglobinopathy evaluation - Hemoglobin A1c - Cystic Fibrosis Mutation 97 - Obstetric Panel, Including HIV - Culture, OB Urine - [place] OB DETAIL +14 [facility]; Future - Protein / creatinine ratio, urine - Comp Met (CMET) - Cytology - PAP - Prenatal Vit-Fe Fumarate-FA (PRENATAL COMPLETE) 14-0.4 MG TABS; Take 1 tablet by mouth daily.  Dispense: 60 each; Refill: 6  Preterm labor symptoms and general obstetric precautions including but not limited to vaginal bleeding, contractions, leaking of fluid and fetal movement were reviewed in detail with the patient. Please refer to After Visit Summary for other counseling recommendations.  Return in about 2 weeks (around 04/17/2017).   ErChancy MilroyMD

## 2017-04-05 LAB — CYTOLOGY - PAP
Bacterial vaginitis: POSITIVE — AB
Candida vaginitis: POSITIVE — AB
Chlamydia: NEGATIVE
DIAGNOSIS: NEGATIVE
NEISSERIA GONORRHEA: NEGATIVE
TRICH (WINDOWPATH): NEGATIVE

## 2017-04-05 LAB — URINE CULTURE, OB REFLEX

## 2017-04-05 LAB — CULTURE, OB URINE

## 2017-04-09 LAB — HEMOGLOBINOPATHY EVALUATION
HEMOGLOBIN A2 QUANTITATION: 2.5 % (ref 1.8–3.2)
HGB A: 97.5 % (ref 96.4–98.8)
HGB C: 0 %
HGB S: 0 %
HGB VARIANT: 0 %
Hemoglobin F Quantitation: 0 % (ref 0.0–2.0)

## 2017-04-09 LAB — PROTEIN / CREATININE RATIO, URINE
Creatinine, Urine: 259.7 mg/dL
Protein, Ur: 20.6 mg/dL
Protein/Creat Ratio: 79 mg/g creat (ref 0–200)

## 2017-04-09 LAB — COMPREHENSIVE METABOLIC PANEL
A/G RATIO: 1.5 (ref 1.2–2.2)
ALBUMIN: 4.3 g/dL (ref 3.5–5.5)
ALT: 9 IU/L (ref 0–32)
AST: 15 IU/L (ref 0–40)
Alkaline Phosphatase: 40 IU/L (ref 39–117)
BILIRUBIN TOTAL: 0.3 mg/dL (ref 0.0–1.2)
BUN / CREAT RATIO: 7 — AB (ref 9–23)
BUN: 5 mg/dL — ABNORMAL LOW (ref 6–20)
CHLORIDE: 101 mmol/L (ref 96–106)
CO2: 19 mmol/L — ABNORMAL LOW (ref 20–29)
Calcium: 9.5 mg/dL (ref 8.7–10.2)
Creatinine, Ser: 0.76 mg/dL (ref 0.57–1.00)
GFR calc non Af Amer: 107 mL/min/{1.73_m2} (ref >59)
GFR, EST AFRICAN AMERICAN: 123 mL/min/{1.73_m2} (ref >59)
GLOBULIN, TOTAL: 2.9 g/dL (ref 1.5–4.5)
GLUCOSE: 73 mg/dL (ref 65–99)
POTASSIUM: 4.2 mmol/L (ref 3.5–5.2)
SODIUM: 136 mmol/L (ref 134–144)
TOTAL PROTEIN: 7.2 g/dL (ref 6.0–8.5)

## 2017-04-09 LAB — OBSTETRIC PANEL, INCLUDING HIV
ANTIBODY SCREEN: NEGATIVE
BASOS: 0 %
Basophils Absolute: 0 10*3/uL (ref 0.0–0.2)
EOS (ABSOLUTE): 0 10*3/uL (ref 0.0–0.4)
EOS: 0 %
HEMATOCRIT: 33.3 % — AB (ref 34.0–46.6)
HEMOGLOBIN: 10.7 g/dL — AB (ref 11.1–15.9)
HIV SCREEN 4TH GENERATION: NONREACTIVE
Hepatitis B Surface Ag: NEGATIVE
Immature Grans (Abs): 0 10*3/uL (ref 0.0–0.1)
Immature Granulocytes: 0 %
LYMPHS ABS: 1.3 10*3/uL (ref 0.7–3.1)
Lymphs: 28 %
MCH: 26.8 pg (ref 26.6–33.0)
MCHC: 32.1 g/dL (ref 31.5–35.7)
MCV: 83 fL (ref 79–97)
MONOS ABS: 0.2 10*3/uL (ref 0.1–0.9)
Monocytes: 5 %
NEUTROS ABS: 3.1 10*3/uL (ref 1.4–7.0)
Neutrophils: 67 %
Platelets: 202 10*3/uL (ref 150–379)
RBC: 4 x10E6/uL (ref 3.77–5.28)
RDW: 17.9 % — AB (ref 12.3–15.4)
RH TYPE: POSITIVE
RPR Ser Ql: NONREACTIVE
Rubella Antibodies, IGG: <0.9 index — ABNORMAL LOW (ref >0.99)
WBC: 4.7 10*3/uL (ref 3.4–10.8)

## 2017-04-09 LAB — HEMOGLOBIN A1C
Est. average glucose Bld gHb Est-mCnc: 88 mg/dL
Hgb A1c MFr Bld: 4.7 % — ABNORMAL LOW (ref 4.8–5.6)

## 2017-04-09 LAB — CYSTIC FIBROSIS MUTATION 97: Interpretation: NOT DETECTED

## 2017-04-10 ENCOUNTER — Telehealth: Payer: Self-pay

## 2017-04-10 MED ORDER — TERCONAZOLE 0.4 % VA CREA: 1.0000 | TOPICAL_CREAM | Freq: Every day | VAGINAL | 0 refills | Status: DC

## 2017-04-10 MED ORDER — METRONIDAZOLE 500 MG PO TABS: 500.0000 mg | ORAL_TABLET | Freq: Two times a day (BID) | ORAL | 0 refills | Status: DC

## 2017-04-10 NOTE — Telephone Encounter (Signed)
-----   Message from Hermina StaggersMichael L Ervin, MD sent at 04/09/2017  9:40 AM EDT ----- Please treat pt's BV and yeast with Terazol 1 applicator qhs x 7 days And Flagyl 500 mg po bid x 7 days Thanks Casimiro NeedleMichael

## 2017-04-10 NOTE — Telephone Encounter (Signed)
Called patient inform her of BV and yeast infection. [facility]

## 2017-04-11 ENCOUNTER — Other Ambulatory Visit: Payer: Self-pay | Admitting: General Practice

## 2017-04-11 DIAGNOSIS — O0992 Supervision of high risk pregnancy, unspecified, second trimester: Secondary | ICD-10-CM

## 2017-04-11 MED ORDER — PRENATAL VITAMINS 0.8 MG PO TABS: 1.0000 | ORAL_TABLET | Freq: Every day | ORAL | 12 refills | Status: DC

## 2017-04-19 ENCOUNTER — Ambulatory Visit (INDEPENDENT_AMBULATORY_CARE_PROVIDER_SITE_OTHER): Payer: Medicaid Other | Admitting: Obstetrics & Gynecology

## 2017-04-19 VITALS — BP 135/85 | HR 67 | Wt 248.7 lb

## 2017-04-19 DIAGNOSIS — O0992 Supervision of high risk pregnancy, unspecified, second trimester: Secondary | ICD-10-CM

## 2017-04-19 DIAGNOSIS — O10919 Unspecified pre-existing hypertension complicating pregnancy, unspecified trimester: Secondary | ICD-10-CM

## 2017-04-19 DIAGNOSIS — O10912 Unspecified pre-existing hypertension complicating pregnancy, second trimester: Secondary | ICD-10-CM

## 2017-04-19 NOTE — Patient Instructions (Signed)

## 2017-04-19 NOTE — Progress Notes (Signed)
    PRENATAL VISIT NOTE  Subjective:  Laura Mcneil is a 31 y.o. G2P0010 at [redacted]w[redacted]d being seen today for ongoing prenatal care.  She is currently monitored for the following issues for this high-risk pregnancy and has Chronic hypertension affecting pregnancy and Supervision of high-risk pregnancy on her problem list.  Patient reports headache and toothache.  Contractions: Not present. Vag. Bleeding: None.  Movement: Present. Denies leaking of fluid.   The following portions of the patient's history were reviewed and updated as appropriate: allergies, current medications, past family history, past medical history, past social history, past surgical history and problem list. Problem list updated.  Objective:   Vitals:   04/19/17 1104  BP: 135/85  Pulse: 67  Weight: 248 lb 11.2 oz (112.8 kg)    Fetal Status: Fetal Heart Rate (bpm): 147   Movement: Present     General:  Alert, oriented and cooperative. Patient is in no acute distress.  Skin: Skin is warm and dry. No rash noted.   Cardiovascular: Normal heart rate noted  Respiratory: Normal respiratory effort, no problems with respiration noted  Abdomen: Soft, gravid, appropriate for gestational age.  Pain/Pressure: Absent        Extremities: Normal range of motion.  Edema: None  Mental Status:  Normal mood and affect. Normal behavior. Normal judgment and thought content.   Assessment and Plan:  Pregnancy: G2P0010 at [redacted]w[redacted]d  1. Chronic hypertension affecting pregnancy Continue present medication  2. Supervision of high risk pregnancy in second trimester Looking for dental care  Preterm labor symptoms and general obstetric precautions including but not limited to vaginal bleeding, contractions, leaking of fluid and fetal movement were reviewed in detail with the patient. Please refer to After Visit Summary for other counseling recommendations.  Return in about 4 weeks (around 05/17/2017). [place] in 1 week  Scheryl Darter, MD

## 2017-04-19 NOTE — Progress Notes (Signed)
Patient here for Routine OB. Patient complains of headaches everyday that started 3 weeks ago, at a pain level of 6 or 8.

## 2017-04-24 ENCOUNTER — Ambulatory Visit (HOSPITAL_COMMUNITY)
Admission: RE | Admit: 2017-04-24 | Discharge: 2017-04-24 | Disposition: A | Discharge status: Home / Self Care | Payer: Medicaid Other | Source: Ambulatory Visit | Attending: Obstetrics and Gynecology | Admitting: Obstetrics and Gynecology

## 2017-04-24 ENCOUNTER — Other Ambulatory Visit: Payer: Self-pay | Admitting: Obstetrics and Gynecology

## 2017-04-24 ENCOUNTER — Encounter (HOSPITAL_COMMUNITY): Payer: Self-pay

## 2017-04-24 DIAGNOSIS — Z6835 Body mass index (BMI) 35.0-35.9, adult: Secondary | ICD-10-CM | POA: Insufficient documentation

## 2017-04-24 DIAGNOSIS — O10919 Unspecified pre-existing hypertension complicating pregnancy, unspecified trimester: Secondary | ICD-10-CM

## 2017-04-24 DIAGNOSIS — O0992 Supervision of high risk pregnancy, unspecified, second trimester: Secondary | ICD-10-CM | POA: Diagnosis not present

## 2017-04-24 DIAGNOSIS — O99212 Obesity complicating pregnancy, second trimester: Secondary | ICD-10-CM | POA: Insufficient documentation

## 2017-04-24 DIAGNOSIS — Z3689 Encounter for other specified antenatal screening: Secondary | ICD-10-CM | POA: Diagnosis not present

## 2017-04-24 DIAGNOSIS — O10912 Unspecified pre-existing hypertension complicating pregnancy, second trimester: Secondary | ICD-10-CM | POA: Diagnosis not present

## 2017-04-24 DIAGNOSIS — E669 Obesity, unspecified: Secondary | ICD-10-CM | POA: Diagnosis not present

## 2017-04-24 DIAGNOSIS — Z3A18 18 weeks gestation of pregnancy: Secondary | ICD-10-CM | POA: Insufficient documentation

## 2017-04-24 DIAGNOSIS — Z3492 Encounter for supervision of normal pregnancy, unspecified, second trimester: Secondary | ICD-10-CM

## 2017-04-25 ENCOUNTER — Other Ambulatory Visit (HOSPITAL_COMMUNITY): Payer: Self-pay | Admitting: *Deleted

## 2017-04-25 DIAGNOSIS — O10919 Unspecified pre-existing hypertension complicating pregnancy, unspecified trimester: Secondary | ICD-10-CM

## 2017-05-13 ENCOUNTER — Other Ambulatory Visit (HOSPITAL_COMMUNITY)
Admission: RE | Admit: 2017-05-13 | Discharge: 2017-05-13 | Disposition: A | Discharge status: Home / Self Care | Payer: Medicaid Other | Source: Ambulatory Visit | Attending: Obstetrics & Gynecology | Admitting: Obstetrics & Gynecology

## 2017-05-13 ENCOUNTER — Ambulatory Visit (INDEPENDENT_AMBULATORY_CARE_PROVIDER_SITE_OTHER): Payer: Medicaid Other | Admitting: Obstetrics & Gynecology

## 2017-05-13 DIAGNOSIS — O23592 Infection of other part of genital tract in pregnancy, second trimester: Secondary | ICD-10-CM | POA: Insufficient documentation

## 2017-05-13 DIAGNOSIS — O0992 Supervision of high risk pregnancy, unspecified, second trimester: Secondary | ICD-10-CM

## 2017-05-13 DIAGNOSIS — Z113 Encounter for screening for infections with a predominantly sexual mode of transmission: Secondary | ICD-10-CM | POA: Diagnosis not present

## 2017-05-13 DIAGNOSIS — Z3A20 20 weeks gestation of pregnancy: Secondary | ICD-10-CM | POA: Diagnosis not present

## 2017-05-13 DIAGNOSIS — B9689 Other specified bacterial agents as the cause of diseases classified elsewhere: Secondary | ICD-10-CM | POA: Diagnosis not present

## 2017-05-13 DIAGNOSIS — B373 Candidiasis of vulva and vagina: Secondary | ICD-10-CM | POA: Insufficient documentation

## 2017-05-13 DIAGNOSIS — N760 Acute vaginitis: Secondary | ICD-10-CM | POA: Insufficient documentation

## 2017-05-13 MED ORDER — FLUCONAZOLE 150 MG PO TABS: 150.0000 mg | ORAL_TABLET | Freq: Once | ORAL | 0 refills | Status: AC

## 2017-05-13 NOTE — Patient Instructions (Signed)

## 2017-05-13 NOTE — Progress Notes (Signed)
Patient here today for routine OB visit 164w6d Patient states of having discharge light greenish, with odor for 1 week.

## 2017-05-13 NOTE — Progress Notes (Signed)
   PRENATAL VISIT NOTE  Subjective:  Donavan Foilnitra Kindred is a 31 y.o. G2P0010 at 554w5d being seen today for ongoing prenatal care.  She is currently monitored for the following issues for this high-risk pregnancy and has Chronic hypertension affecting pregnancy and Supervision of high-risk pregnancy on her problem list.  Patient reports vaginal irritation.  Contractions: Not present. Vag. Bleeding: None.  Movement: Present. Denies leaking of fluid.   The following portions of the patient's history were reviewed and updated as appropriate: allergies, current medications, past family history, past medical history, past social history, past surgical history and problem list. Problem list updated.  Objective:   Vitals:   05/13/17 0923  BP: 140/78  Pulse: 92  Weight: 112.6 kg (248 lb 3.2 oz)    Fetal Status: Fetal Heart Rate (bpm): 150   Movement: Present     General:  Alert, oriented and cooperative. Patient is in no acute distress.  Skin: Skin is warm and dry. No rash noted.   Cardiovascular: Normal heart rate noted  Respiratory: Normal respiratory effort, no problems with respiration noted  Abdomen: Soft, gravid, appropriate for gestational age.  Pain/Pressure: Absent     Pelvic: Cervical exam deferred        Extremities: Normal range of motion.  Edema: None  Mental Status:  Normal mood and affect. Normal behavior. Normal judgment and thought content.   Assessment and Plan:  Pregnancy: G2P0010 at 7754w5d  1. Supervision of high risk pregnancy in second trimester White d/c c/w yeast - Cervicovaginal ancillary only - fluconazole (DIFLUCAN) 150 MG tablet; Take 1 tablet (150 mg total) by mouth once.  Dispense: 1 tablet; Refill: 0  Preterm labor symptoms and general obstetric precautions including but not limited to vaginal bleeding, contractions, leaking of fluid and fetal movement were reviewed in detail with the patient. Please refer to After Visit Summary for other counseling  recommendations.  Return in about 4 weeks (around 06/10/2017).   Scheryl DarterJames Terril Chestnut, MD

## 2017-05-14 LAB — CERVICOVAGINAL ANCILLARY ONLY
Bacterial vaginitis: POSITIVE — AB
CHLAMYDIA, DNA PROBE: NEGATIVE
Candida vaginitis: POSITIVE — AB
NEISSERIA GONORRHEA: NEGATIVE
Trichomonas: NEGATIVE

## 2017-05-15 ENCOUNTER — Other Ambulatory Visit: Payer: Self-pay | Admitting: Obstetrics and Gynecology

## 2017-05-16 ENCOUNTER — Telehealth: Payer: Self-pay | Admitting: Lab

## 2017-05-16 ENCOUNTER — Other Ambulatory Visit: Payer: Self-pay | Admitting: *Deleted

## 2017-05-16 DIAGNOSIS — B9689 Other specified bacterial agents as the cause of diseases classified elsewhere: Secondary | ICD-10-CM

## 2017-05-16 DIAGNOSIS — B379 Candidiasis, unspecified: Secondary | ICD-10-CM

## 2017-05-16 DIAGNOSIS — N760 Acute vaginitis: Principal | ICD-10-CM

## 2017-05-16 MED ORDER — FLUCONAZOLE 150 MG PO TABS: 150.0000 mg | ORAL_TABLET | Freq: Once | ORAL | 0 refills | Status: AC

## 2017-05-16 MED ORDER — METRONIDAZOLE 500 MG PO TABS: 500.0000 mg | ORAL_TABLET | Freq: Two times a day (BID) | ORAL | 0 refills | Status: DC

## 2017-05-16 NOTE — Progress Notes (Signed)
See recent lab result. Flagyl and Diflucan sent per Dr Debroah LoopArnold order.

## 2017-05-16 NOTE — Telephone Encounter (Signed)
Called patient to give her the results of her labs. Patient stated she saw her results on My Chart and she went to the pharmacy and picked up her RX, and already started taking it. I asked patient if she had any questions and she said no.

## 2017-05-16 NOTE — Telephone Encounter (Signed)
-----   Message from Adam PhenixJames G Arnold, MD sent at 05/15/2017  2:24 PM EDT ----- BV and yeast, rx diflucan and flagyl 500 mg BID 7 days

## 2017-05-22 ENCOUNTER — Ambulatory Visit (HOSPITAL_COMMUNITY)
Admission: RE | Admit: 2017-05-22 | Discharge: 2017-05-22 | Disposition: A | Discharge status: Home / Self Care | Payer: Medicaid Other | Source: Ambulatory Visit | Attending: Obstetrics and Gynecology | Admitting: Obstetrics and Gynecology

## 2017-05-22 ENCOUNTER — Encounter (HOSPITAL_COMMUNITY): Payer: Self-pay

## 2017-05-22 DIAGNOSIS — Z3A22 22 weeks gestation of pregnancy: Secondary | ICD-10-CM | POA: Diagnosis not present

## 2017-05-22 DIAGNOSIS — O10019 Pre-existing essential hypertension complicating pregnancy, unspecified trimester: Secondary | ICD-10-CM | POA: Diagnosis present

## 2017-05-22 DIAGNOSIS — O99212 Obesity complicating pregnancy, second trimester: Secondary | ICD-10-CM | POA: Insufficient documentation

## 2017-05-22 DIAGNOSIS — O10012 Pre-existing essential hypertension complicating pregnancy, second trimester: Secondary | ICD-10-CM | POA: Diagnosis not present

## 2017-05-22 DIAGNOSIS — O10919 Unspecified pre-existing hypertension complicating pregnancy, unspecified trimester: Secondary | ICD-10-CM

## 2017-05-22 NOTE — Addendum Note (Signed)
Encounter addended by: Earley Brooke on: 05/22/2017  8:46 AM<BR>    Actions taken: Imaging Exam ended

## 2017-06-10 ENCOUNTER — Ambulatory Visit (INDEPENDENT_AMBULATORY_CARE_PROVIDER_SITE_OTHER): Payer: Medicaid Other | Admitting: Obstetrics & Gynecology

## 2017-06-10 ENCOUNTER — Encounter: Payer: Medicaid Other | Admitting: Obstetrics & Gynecology

## 2017-06-10 VITALS — BP 131/75 | HR 85 | Wt 247.5 lb

## 2017-06-10 DIAGNOSIS — E669 Obesity, unspecified: Secondary | ICD-10-CM | POA: Insufficient documentation

## 2017-06-10 DIAGNOSIS — Z23 Encounter for immunization: Secondary | ICD-10-CM | POA: Diagnosis present

## 2017-06-10 DIAGNOSIS — O99210 Obesity complicating pregnancy, unspecified trimester: Secondary | ICD-10-CM

## 2017-06-10 DIAGNOSIS — O99212 Obesity complicating pregnancy, second trimester: Secondary | ICD-10-CM

## 2017-06-10 DIAGNOSIS — O0992 Supervision of high risk pregnancy, unspecified, second trimester: Secondary | ICD-10-CM

## 2017-06-10 NOTE — Progress Notes (Signed)
Educated pt on Good Latch 

## 2017-06-19 ENCOUNTER — Encounter (HOSPITAL_COMMUNITY): Payer: Self-pay

## 2017-06-19 ENCOUNTER — Ambulatory Visit (HOSPITAL_COMMUNITY)
Admission: RE | Admit: 2017-06-19 | Discharge: 2017-06-19 | Disposition: A | Discharge status: Home / Self Care | Payer: Medicaid Other | Source: Ambulatory Visit | Attending: Obstetrics and Gynecology | Admitting: Obstetrics and Gynecology

## 2017-06-19 ENCOUNTER — Other Ambulatory Visit (HOSPITAL_COMMUNITY): Payer: Self-pay | Admitting: Maternal and Fetal Medicine

## 2017-06-19 DIAGNOSIS — Z6835 Body mass index (BMI) 35.0-35.9, adult: Secondary | ICD-10-CM | POA: Insufficient documentation

## 2017-06-19 DIAGNOSIS — O10012 Pre-existing essential hypertension complicating pregnancy, second trimester: Secondary | ICD-10-CM | POA: Insufficient documentation

## 2017-06-19 DIAGNOSIS — Z362 Encounter for other antenatal screening follow-up: Secondary | ICD-10-CM | POA: Diagnosis not present

## 2017-06-19 DIAGNOSIS — O99212 Obesity complicating pregnancy, second trimester: Secondary | ICD-10-CM | POA: Diagnosis not present

## 2017-06-19 DIAGNOSIS — O0992 Supervision of high risk pregnancy, unspecified, second trimester: Secondary | ICD-10-CM

## 2017-06-19 DIAGNOSIS — Z3A26 26 weeks gestation of pregnancy: Secondary | ICD-10-CM | POA: Insufficient documentation

## 2017-06-19 DIAGNOSIS — O10919 Unspecified pre-existing hypertension complicating pregnancy, unspecified trimester: Secondary | ICD-10-CM

## 2017-06-19 DIAGNOSIS — O10019 Pre-existing essential hypertension complicating pregnancy, unspecified trimester: Secondary | ICD-10-CM | POA: Diagnosis present

## 2017-06-19 DIAGNOSIS — O99210 Obesity complicating pregnancy, unspecified trimester: Secondary | ICD-10-CM

## 2017-07-08 ENCOUNTER — Ambulatory Visit (INDEPENDENT_AMBULATORY_CARE_PROVIDER_SITE_OTHER): Payer: Medicaid Other | Admitting: Obstetrics & Gynecology

## 2017-07-08 VITALS — BP 131/83 | HR 93 | Wt 248.0 lb

## 2017-07-08 DIAGNOSIS — O10913 Unspecified pre-existing hypertension complicating pregnancy, third trimester: Secondary | ICD-10-CM

## 2017-07-08 DIAGNOSIS — Z23 Encounter for immunization: Secondary | ICD-10-CM

## 2017-07-08 DIAGNOSIS — O0993 Supervision of high risk pregnancy, unspecified, third trimester: Secondary | ICD-10-CM | POA: Diagnosis not present

## 2017-07-08 DIAGNOSIS — O10919 Unspecified pre-existing hypertension complicating pregnancy, unspecified trimester: Secondary | ICD-10-CM

## 2017-07-08 DIAGNOSIS — O99210 Obesity complicating pregnancy, unspecified trimester: Secondary | ICD-10-CM

## 2017-07-08 DIAGNOSIS — O0992 Supervision of high risk pregnancy, unspecified, second trimester: Secondary | ICD-10-CM

## 2017-07-08 MED ORDER — TETANUS-DIPHTH-ACELL PERTUSSIS 5-2.5-18.5 LF-MCG/0.5 IM SUSP
0.5000 mL | Freq: Once | INTRAMUSCULAR | Status: AC
  Administered 2017-07-08: 0.5 mL via INTRAMUSCULAR

## 2017-07-08 NOTE — Patient Instructions (Addendum)
AREA PEDIATRIC/FAMILY PRACTICE PHYSICIANS  [facility] [address], [address] [place], [place]  [address] Phone - [phone]   Fax - [phone]  [facility] [address] [place], [place] [address] Phone - [phone]   Fax - [phone]  [facility] AM[facility] [address] [place], [place]  [address] Phone - [phone]   Fax - [phone]  [facility] [address] [place], [place]  [address] Phone - [phone]   Fax - [phone]  [facility] [address] [place], [place]  [address] Phone - [phone]   Fax - [phone]  [facility] [address] [place], [place]  [address] Phone - [phone]   Fax - [phone]  [facility] [address], [address] [place], [place]  [address] Phone - [phone]   Fax - [phone]  [facility] [address], [address] [place], [place]  [address] Phone - [phone]   Fax - [phone]  [facility] [address] [place], [place]  [address] Phone - [phone]   Fax - [phone] [facility] [address] [place], [place]  [address] Phone - [phone]   Fax - [phone]  [facility] [address] [place], [place]  [address] Phone - [phone]   Fax - [phone]  [facility] [address] [place], [place]  [address] Phone - [phone]   Fax - [phone]  [facility] [address], [address] [place], [place]  [address] Phone - [phone]   Fax - [phone]  SHILPA GOSRANI [address] [place], [place]  [address] Phone - [phone]  [facility] [address] [place], [place]  [address] Phone - [phone]   Fax - [phone]  [facility] [address] [place], [place]  [address] Phone - [phone]   Fax - [phone]  [facility] [address] [place], [place]  [address] Phone - [phone]   Fax - [phone]  [facility] [address] [place], [place]  [address] Phone - [phone]   Fax - [phone]   [facility] [address] [place], [place]  [address] Phone - [phone]   Fax - [phone]  [facility] [address] [place], [place]  [address] Phone - [phone]   Fax - [phone]  DAVID RUBIN [address] [place], [place]  [address] Phone - [phone]   Fax - [phone]  [facility] [address] [place], [place]  [address] Phone - [phone]   Fax - [phone]  [facility] [address] , [place]  [address] Phone - [phone]   Fax - [phone] [facility] [address] [place], [place]  [address] Phone - [phone]   Fax - [phone]   [facility] [address] [place], [place]  [address] Phone - [phone]   Fax - [phone]   [facility] [address] [place],   [address] Phone - [phone]   Fax - [phone]  [facility] Charlene Flemming MD [address] [place]  [address] Phone [phone]  Fax [phone]   Third Trimester of Pregnancy The third trimester is from week 29 through week 42,   months 7 through 9. This trimester is when your unborn baby (fetus) is growing very fast. At the end of the ninth month, the unborn baby is about 20 inches in length. It weighs about 6-10 pounds. Follow these instructions at home:  Avoid all smoking, herbs, and alcohol. Avoid drugs not approved by your doctor.  Do not use any tobacco products, including cigarettes, chewing tobacco, and electronic  cigarettes. If you need help quitting, ask your doctor. You may get counseling or other support to help you quit.  Only take medicine as told by your doctor. Some medicines are safe and some are not during pregnancy.  Exercise only as told by your doctor. Stop exercising if you start having cramps.  Eat regular, healthy meals.  Wear a good support bra if your breasts are tender.  Do not use hot tubs, steam rooms, or saunas.  Wear your seat belt when driving.  Avoid raw meat, uncooked cheese, and liter boxes and soil used by cats.  Take your prenatal vitamins.  Take 1500-2000 milligrams of calcium daily starting at the 20th week of pregnancy until you deliver your baby.  Try taking medicine that helps you poop (stool softener) as needed, and if your doctor approves. Eat more fiber by eating fresh fruit, vegetables, and whole grains. Drink enough fluids to keep your pee (urine) clear or pale yellow.  Take warm water baths (sitz baths) to soothe pain or discomfort caused by hemorrhoids. Use hemorrhoid cream if your doctor approves.  If you have puffy, bulging veins (varicose veins), wear support hose. Raise (elevate) your feet for 15 minutes, 3-4 times a day. Limit salt in your diet.  Avoid heavy lifting, wear low heels, and sit up straight.  Rest with your legs raised if you have leg cramps or low back pain.  Visit your dentist if you have not gone during your pregnancy. Use a soft toothbrush to brush your teeth. Be gentle when you floss.  You can have sex (intercourse) unless your doctor tells you not to.  Do not travel far distances unless you must. Only do so with your doctor's approval.  Take prenatal classes.  Practice driving to the hospital.  Pack your hospital bag.  Prepare the baby's room.  Go to your doctor visits. Get help if:  You are not sure if you are in labor or if your water has broken.  You are dizzy.  You have mild cramps or pressure in your lower  belly (abdominal).  You have a nagging pain in your belly area.  You continue to feel sick to your stomach (nauseous), throw up (vomit), or have watery poop (diarrhea).  You have bad smelling fluid coming from your vagina.  You have pain with peeing (urination). Get help right away if:  You have a fever.  You are leaking fluid from your vagina.  You are spotting or bleeding from your vagina.  You have severe belly cramping or pain.  You lose or gain weight rapidly.  You have trouble catching your breath and have chest pain.  You notice sudden or extreme puffiness (swelling) of your face, hands, ankles, feet, or legs.  You have not felt the baby move in over an hour.  You have severe headaches that do not go away with medicine.  You have vision changes. This information is not intended to replace advice given to you by your health care provider. Make sure you discuss any questions you have with your health care provider. Document   Released: 11/14/2009 Document Revised: 01/26/2016 Document Reviewed: 10/21/2012 Elsevier Interactive Patient Education  2017 Elsevier Inc.  

## 2017-07-08 NOTE — Progress Notes (Signed)
   PRENATAL VISIT NOTE  Subjective:  Laura Mcneil is a 31 y.o. G2P0010 at 6643w5d being seen today for ongoing prenatal care.  She is currently monitored for the following issues for this high-risk pregnancy and has Chronic hypertension affecting pregnancy; Supervision of high-risk pregnancy; and Obesity in pregnancy on their problem list.  Patient reports no complaints.  Contractions: Not present. Vag. Bleeding: None.  Movement: Present. Denies leaking of fluid.   The following portions of the patient's history were reviewed and updated as appropriate: allergies, current medications, past family history, past medical history, past social history, past surgical history and problem list. Problem list updated.  Objective:   Vitals:   07/08/17 0802  BP: 131/83  Pulse: 93  Weight: 112.5 kg (248 lb)    Fetal Status: Fetal Heart Rate (bpm): 144 Fundal Height: 28 cm Movement: Present     General:  Alert, oriented and cooperative. Patient is in no acute distress.  Skin: Skin is warm and dry. No rash noted.   Cardiovascular: Normal heart rate noted  Respiratory: Normal respiratory effort, no problems with respiration noted  Abdomen: Soft, gravid, appropriate for gestational age.  Pain/Pressure: Absent     Pelvic: Cervical exam deferred        Extremities: Normal range of motion.  Edema: None  Mental Status:  Normal mood and affect. Normal behavior. Normal judgment and thought content.   Assessment and Plan:  Pregnancy: G2P0010 at 5743w5d  1. Chronic hypertension affecting pregnancy Routine 28 week labs - CBC - Glucose Tolerance, 2 Hours w/1 Hour - RPR - HIV antibody  2. Supervision of high risk pregnancy in second trimester  - CBC - Glucose Tolerance, 2 Hours w/1 Hour - RPR - HIV antibody  3. Obesity in pregnancy  - CBC - Glucose Tolerance, 2 Hours w/1 Hour - RPR - HIV antibody  Preterm labor symptoms and general obstetric precautions including but not limited to vaginal  bleeding, contractions, leaking of fluid and fetal movement were reviewed in detail with the patient. Please refer to After Visit Summary for other counseling recommendations.  Return in about 2 weeks (around 07/22/2017).   Scheryl DarterJames Kirra Verga, MD

## 2017-07-09 LAB — CBC
HEMATOCRIT: 31.3 % — AB (ref 34.0–46.6)
HEMOGLOBIN: 10.5 g/dL — AB (ref 11.1–15.9)
MCH: 28.9 pg (ref 26.6–33.0)
MCHC: 33.5 g/dL (ref 31.5–35.7)
MCV: 86 fL (ref 79–97)
Platelets: 218 10*3/uL (ref 150–379)
RBC: 3.63 x10E6/uL — ABNORMAL LOW (ref 3.77–5.28)
RDW: 15.1 % (ref 12.3–15.4)
WBC: 7.2 10*3/uL (ref 3.4–10.8)

## 2017-07-09 LAB — GLUCOSE TOLERANCE, 2 HOURS W/ 1HR
Glucose, 1 hour: 91 mg/dL (ref 65–179)
Glucose, 2 hour: 85 mg/dL (ref 65–152)
Glucose, Fasting: 72 mg/dL (ref 65–91)

## 2017-07-09 LAB — RPR: RPR Ser Ql: NONREACTIVE

## 2017-07-09 LAB — HIV ANTIBODY (ROUTINE TESTING W REFLEX): HIV SCREEN 4TH GENERATION: NONREACTIVE

## 2017-07-16 ENCOUNTER — Encounter (HOSPITAL_COMMUNITY): Payer: Self-pay

## 2017-07-17 ENCOUNTER — Ambulatory Visit (HOSPITAL_COMMUNITY)
Admission: RE | Admit: 2017-07-17 | Discharge: 2017-07-17 | Disposition: A | Discharge status: Home / Self Care | Payer: Medicaid Other | Source: Ambulatory Visit | Attending: Obstetrics and Gynecology | Admitting: Obstetrics and Gynecology

## 2017-07-17 ENCOUNTER — Other Ambulatory Visit (HOSPITAL_COMMUNITY): Payer: Self-pay | Admitting: Maternal and Fetal Medicine

## 2017-07-17 ENCOUNTER — Encounter (HOSPITAL_COMMUNITY): Payer: Self-pay

## 2017-07-17 DIAGNOSIS — Z3A30 30 weeks gestation of pregnancy: Secondary | ICD-10-CM

## 2017-07-17 DIAGNOSIS — O99213 Obesity complicating pregnancy, third trimester: Secondary | ICD-10-CM

## 2017-07-17 DIAGNOSIS — Z6835 Body mass index (BMI) 35.0-35.9, adult: Secondary | ICD-10-CM | POA: Insufficient documentation

## 2017-07-17 DIAGNOSIS — Z362 Encounter for other antenatal screening follow-up: Secondary | ICD-10-CM | POA: Insufficient documentation

## 2017-07-17 DIAGNOSIS — O10013 Pre-existing essential hypertension complicating pregnancy, third trimester: Secondary | ICD-10-CM | POA: Diagnosis not present

## 2017-07-17 DIAGNOSIS — O10919 Unspecified pre-existing hypertension complicating pregnancy, unspecified trimester: Secondary | ICD-10-CM

## 2017-07-17 DIAGNOSIS — O10913 Unspecified pre-existing hypertension complicating pregnancy, third trimester: Secondary | ICD-10-CM | POA: Diagnosis present

## 2017-07-17 DIAGNOSIS — E669 Obesity, unspecified: Secondary | ICD-10-CM | POA: Diagnosis not present

## 2017-07-18 NOTE — Progress Notes (Deleted)
   PRENATAL VISIT NOTE  Subjective:  Donavan Foilnitra Ranum is a 31 y.o. G2P0010 at 1623w1d being seen today for ongoing prenatal care.  She is currently monitored for the following issues for this high-risk pregnancy and has Chronic hypertension affecting pregnancy; Supervision of high-risk pregnancy; and Obesity in pregnancy on their problem list.  Patient reports {sx:14538}.   .  .   . Denies leaking of fluid.   The following portions of the patient's history were reviewed and updated as appropriate: allergies, current medications, past family history, past medical history, past social history, past surgical history and problem list. Problem list updated.  Objective:  There were no vitals filed for this visit.  Fetal Status:           General:  Alert, oriented and cooperative. Patient is in no acute distress.  Skin: Skin is warm and dry. No rash noted.   Cardiovascular: Normal heart rate noted  Respiratory: Normal respiratory effort, no problems with respiration noted  Abdomen: Soft, gravid, appropriate for gestational age.        Pelvic: {Blank single:19197::"Cervical exam performed","Cervical exam deferred"}        Extremities: Normal range of motion.     Mental Status:  Normal mood and affect. Normal behavior. Normal judgment and thought content.   Assessment and Plan:  Pregnancy: G2P0010 at 8323w1d  1. Supervision of high risk pregnancy in third trimester ***  2. Chronic hypertension affecting pregnancy BP well-controlled on labetalol  Appropriate interval growth on U/S on 11/14 - F/u growth U/S on 12/12 - Begin antenatal testing at 32 weeks - Continue ASA 81 mg daily  3. Obesity in pregnancy   Preterm labor symptoms and general obstetric precautions including but not limited to vaginal bleeding, contractions, leaking of fluid and fetal movement were reviewed in detail with the patient. Please refer to After Visit Summary for other counseling recommendations.  No Follow-up on  file.   Frederik PearJulie P Degele, MD

## 2017-07-23 ENCOUNTER — Encounter: Payer: Medicaid Other | Admitting: Family Medicine

## 2017-07-30 ENCOUNTER — Encounter: Payer: Self-pay | Admitting: Obstetrics & Gynecology

## 2017-07-31 ENCOUNTER — Other Ambulatory Visit: Payer: Self-pay | Admitting: General Practice

## 2017-07-31 DIAGNOSIS — B379 Candidiasis, unspecified: Secondary | ICD-10-CM

## 2017-07-31 MED ORDER — TERCONAZOLE 0.4 % VA CREA: 1.0000 | TOPICAL_CREAM | Freq: Every day | VAGINAL | 0 refills | Status: AC

## 2017-08-08 ENCOUNTER — Other Ambulatory Visit: Payer: Self-pay

## 2017-08-08 ENCOUNTER — Encounter (HOSPITAL_COMMUNITY): Payer: Self-pay | Admitting: *Deleted

## 2017-08-08 ENCOUNTER — Ambulatory Visit (INDEPENDENT_AMBULATORY_CARE_PROVIDER_SITE_OTHER): Payer: Medicaid Other | Admitting: Obstetrics and Gynecology

## 2017-08-08 ENCOUNTER — Inpatient Hospital Stay (HOSPITAL_COMMUNITY)
Admission: AD | Admit: 2017-08-08 | Discharge: 2017-08-19 | DRG: 807 | Disposition: A | Discharge status: Home / Self Care | Payer: Medicaid Other | Source: Ambulatory Visit | Attending: Obstetrics and Gynecology | Admitting: Obstetrics and Gynecology

## 2017-08-08 VITALS — BP 166/96 | HR 96 | Wt 256.7 lb

## 2017-08-08 DIAGNOSIS — E669 Obesity, unspecified: Secondary | ICD-10-CM | POA: Diagnosis present

## 2017-08-08 DIAGNOSIS — O99824 Streptococcus B carrier state complicating childbirth: Secondary | ICD-10-CM | POA: Diagnosis present

## 2017-08-08 DIAGNOSIS — D649 Anemia, unspecified: Secondary | ICD-10-CM | POA: Diagnosis present

## 2017-08-08 DIAGNOSIS — O9902 Anemia complicating childbirth: Secondary | ICD-10-CM | POA: Diagnosis present

## 2017-08-08 DIAGNOSIS — O10919 Unspecified pre-existing hypertension complicating pregnancy, unspecified trimester: Secondary | ICD-10-CM

## 2017-08-08 DIAGNOSIS — R030 Elevated blood-pressure reading, without diagnosis of hypertension: Secondary | ICD-10-CM | POA: Diagnosis present

## 2017-08-08 DIAGNOSIS — O99210 Obesity complicating pregnancy, unspecified trimester: Secondary | ICD-10-CM

## 2017-08-08 DIAGNOSIS — Z3A33 33 weeks gestation of pregnancy: Secondary | ICD-10-CM

## 2017-08-08 DIAGNOSIS — O119 Pre-existing hypertension with pre-eclampsia, unspecified trimester: Secondary | ICD-10-CM | POA: Diagnosis present

## 2017-08-08 DIAGNOSIS — O289 Unspecified abnormal findings on antenatal screening of mother: Secondary | ICD-10-CM

## 2017-08-08 DIAGNOSIS — Z7982 Long term (current) use of aspirin: Secondary | ICD-10-CM

## 2017-08-08 DIAGNOSIS — O113 Pre-existing hypertension with pre-eclampsia, third trimester: Secondary | ICD-10-CM | POA: Diagnosis present

## 2017-08-08 DIAGNOSIS — Z3A34 34 weeks gestation of pregnancy: Secondary | ICD-10-CM | POA: Diagnosis not present

## 2017-08-08 DIAGNOSIS — O99213 Obesity complicating pregnancy, third trimester: Secondary | ICD-10-CM

## 2017-08-08 DIAGNOSIS — O99214 Obesity complicating childbirth: Secondary | ICD-10-CM | POA: Diagnosis present

## 2017-08-08 DIAGNOSIS — O288 Other abnormal findings on antenatal screening of mother: Secondary | ICD-10-CM

## 2017-08-08 DIAGNOSIS — O114 Pre-existing hypertension with pre-eclampsia, complicating childbirth: Principal | ICD-10-CM | POA: Diagnosis present

## 2017-08-08 DIAGNOSIS — O0993 Supervision of high risk pregnancy, unspecified, third trimester: Secondary | ICD-10-CM

## 2017-08-08 DIAGNOSIS — O10913 Unspecified pre-existing hypertension complicating pregnancy, third trimester: Secondary | ICD-10-CM

## 2017-08-08 DIAGNOSIS — O1002 Pre-existing essential hypertension complicating childbirth: Secondary | ICD-10-CM | POA: Diagnosis present

## 2017-08-08 DIAGNOSIS — O99820 Streptococcus B carrier state complicating pregnancy: Secondary | ICD-10-CM | POA: Diagnosis not present

## 2017-08-08 HISTORY — DX: Pre-existing hypertension with pre-eclampsia, unspecified trimester: O11.9

## 2017-08-08 LAB — POCT URINALYSIS DIP (DEVICE)
Bilirubin Urine: NEGATIVE
Glucose, UA: NEGATIVE mg/dL
HGB URINE DIPSTICK: NEGATIVE
NITRITE: NEGATIVE
Protein, ur: NEGATIVE mg/dL
UROBILINOGEN UA: 1 mg/dL (ref 0.0–1.0)
pH: 6 (ref 5.0–8.0)

## 2017-08-08 LAB — COMPREHENSIVE METABOLIC PANEL
ALBUMIN: 3.3 g/dL — AB (ref 3.5–5.0)
ALK PHOS: 88 U/L (ref 38–126)
ALT: 18 U/L (ref 14–54)
ANION GAP: 12 (ref 5–15)
AST: 24 U/L (ref 15–41)
BUN: 9 mg/dL (ref 6–20)
CALCIUM: 9.3 mg/dL (ref 8.9–10.3)
CO2: 18 mmol/L — AB (ref 22–32)
Chloride: 106 mmol/L (ref 101–111)
Creatinine, Ser: 0.67 mg/dL (ref 0.44–1.00)
GFR calc non Af Amer: >60 mL/min (ref >60)
Glucose, Bld: 76 mg/dL (ref 65–99)
POTASSIUM: 3.9 mmol/L (ref 3.5–5.1)
SODIUM: 136 mmol/L (ref 135–145)
Total Bilirubin: 0.4 mg/dL (ref 0.3–1.2)
Total Protein: 6.9 g/dL (ref 6.5–8.1)

## 2017-08-08 LAB — CBC
HCT: 31.5 % — ABNORMAL LOW (ref 36.0–46.0)
HEMOGLOBIN: 10.5 g/dL — AB (ref 12.0–15.0)
MCH: 29.5 pg (ref 26.0–34.0)
MCHC: 33.3 g/dL (ref 30.0–36.0)
MCV: 88.5 fL (ref 78.0–100.0)
PLATELETS: 188 10*3/uL (ref 150–400)
RBC: 3.56 MIL/uL — ABNORMAL LOW (ref 3.87–5.11)
RDW: 14.1 % (ref 11.5–15.5)
WBC: 5.8 10*3/uL (ref 4.0–10.5)

## 2017-08-08 LAB — PROTEIN / CREATININE RATIO, URINE
Creatinine, Urine: 252 mg/dL
PROTEIN CREATININE RATIO: 0.08 mg/mg{creat} (ref 0.00–0.15)
TOTAL PROTEIN, URINE: 20 mg/dL

## 2017-08-08 LAB — TYPE AND SCREEN
ABO/RH(D): B POS
Antibody Screen: NEGATIVE

## 2017-08-08 LAB — ABO/RH: ABO/RH(D): B POS

## 2017-08-08 MED ORDER — MAGNESIUM SULFATE BOLUS VIA INFUSION
4.0000 g | Freq: Once | INTRAVENOUS | Status: AC
  Administered 2017-08-08: 4 g via INTRAVENOUS
  Filled 2017-08-08: qty 500

## 2017-08-08 MED ORDER — MAGNESIUM SULFATE 40 G IN LACTATED RINGERS - SIMPLE
2.0000 g/h | INTRAVENOUS | Status: AC
  Administered 2017-08-08 – 2017-08-09 (×2): 2 g/h via INTRAVENOUS
  Filled 2017-08-08 (×2): qty 40

## 2017-08-08 MED ORDER — HYDRALAZINE HCL 20 MG/ML IJ SOLN
10.0000 mg | Freq: Once | INTRAMUSCULAR | Status: AC | PRN
Start: 2017-08-08 — End: 2017-08-08
  Administered 2017-08-08: 10 mg via INTRAVENOUS
  Filled 2017-08-08: qty 1

## 2017-08-08 MED ORDER — SODIUM CHLORIDE 0.9 % IV SOLN: 250.0000 mL | INTRAVENOUS | Status: DC | PRN

## 2017-08-08 MED ORDER — DOCUSATE SODIUM 100 MG PO CAPS
100.0000 mg | ORAL_CAPSULE | Freq: Every day | ORAL | Status: DC
  Administered 2017-08-09 – 2017-08-12 (×4): 100 mg via ORAL
  Filled 2017-08-08 (×3): qty 1

## 2017-08-08 MED ORDER — ZOLPIDEM TARTRATE 5 MG PO TABS: 5.0000 mg | ORAL_TABLET | Freq: Every evening | ORAL | Status: DC | PRN

## 2017-08-08 MED ORDER — PRENATAL MULTIVITAMIN CH
1.0000 | ORAL_TABLET | Freq: Every day | ORAL | Status: DC
  Administered 2017-08-09 – 2017-08-13 (×5): 1 via ORAL
  Filled 2017-08-08 (×4): qty 1

## 2017-08-08 MED ORDER — LACTATED RINGERS IV SOLN
INTRAVENOUS | Status: DC
  Administered 2017-08-08: 100 mL/h via INTRAVENOUS
  Administered 2017-08-08: 16:00:00 via INTRAVENOUS

## 2017-08-08 MED ORDER — SODIUM CHLORIDE 0.9% FLUSH: 3.0000 mL | INTRAVENOUS | Status: DC | PRN

## 2017-08-08 MED ORDER — NIFEDIPINE 10 MG PO CAPS
10.0000 mg | ORAL_CAPSULE | Freq: Once | ORAL | Status: AC
  Administered 2017-08-08: 10 mg via ORAL
  Filled 2017-08-08: qty 1

## 2017-08-08 MED ORDER — HYDRALAZINE HCL 20 MG/ML IJ SOLN
10.0000 mg | Freq: Once | INTRAMUSCULAR | Status: AC
  Administered 2017-08-08: 10 mg via INTRAVENOUS
  Filled 2017-08-08: qty 1

## 2017-08-08 MED ORDER — ACETAMINOPHEN 325 MG PO TABS
650.0000 mg | ORAL_TABLET | ORAL | Status: DC | PRN
  Administered 2017-08-09 – 2017-08-13 (×3): 650 mg via ORAL
  Filled 2017-08-08 (×3): qty 2

## 2017-08-08 MED ORDER — BETAMETHASONE SOD PHOS & ACET 6 (3-3) MG/ML IJ SUSP
12.0000 mg | INTRAMUSCULAR | Status: AC
  Administered 2017-08-08 – 2017-08-09 (×2): 12 mg via INTRAMUSCULAR
  Filled 2017-08-08 (×2): qty 2

## 2017-08-08 MED ORDER — LABETALOL HCL 5 MG/ML IV SOLN
20.0000 mg | INTRAVENOUS | Status: AC | PRN
  Administered 2017-08-08: 40 mg via INTRAVENOUS
  Administered 2017-08-08: 80 mg via INTRAVENOUS
  Administered 2017-08-08: 20 mg via INTRAVENOUS
  Filled 2017-08-08: qty 16
  Filled 2017-08-08: qty 8
  Filled 2017-08-08: qty 4

## 2017-08-08 MED ORDER — CALCIUM CARBONATE ANTACID 500 MG PO CHEW: 2.0000 | CHEWABLE_TABLET | ORAL | Status: DC | PRN

## 2017-08-08 MED ORDER — SODIUM CHLORIDE 0.9% FLUSH
3.0000 mL | Freq: Two times a day (BID) | INTRAVENOUS | Status: DC
  Administered 2017-08-09 – 2017-08-13 (×8): 3 mL via INTRAVENOUS

## 2017-08-08 NOTE — MAU Note (Signed)
History     CSN: [id number]  Arrival date and time: 08/08/17 1503   First Provider Initiated Contact with Patient 08/08/17 1550      Chief Complaint  Patient presents with  . Hypertension   HPI   Laura Mcneil 31 y.o. G2P0010 at 33.1 with history of cHTN sent up from clinic due to x2 severe BP readings in office (167/106, 166/96). Pressures at admission here are 171/98 and 170/102. Patient reports "slight headache" today. Denies visual changes, abdominal pain, or increased swelling .Has had "bad" headaches throughout pregnancy that have been unresponsive to OTC Tylenol, but were not associated with visual changes or abdominal pain.States she takes BID Labetalol and QD ASA, denies taking medication prior to pregnancy but notes she had "a little" hypertensive problems before the pregnancy.   Positive fetal movement. Complains of white vaginal discharge from recently diagnosed yeast infection that she has not taken prescription for yet. Denies vaginal bleeding, LOF, or CTX.   OB History    Gravida Para Term Preterm AB Living   2       1 0   SAB TAB Ectopic Multiple Live Births                  Past Medical History:  Diagnosis Date  . Hypertension     Past Surgical History:  Procedure Laterality Date  . NO PAST SURGERIES      Family History  Problem Relation Age of Onset  . Hypertension Mother   . Cancer Mother     Social History   Tobacco Use  . Smoking status: Never Smoker  . Smokeless tobacco: Never Used  Substance Use Topics  . Alcohol use: No    Comment: occ  . Drug use: No    Allergies: No Known Allergies  Medications Prior to Admission  Medication Sig Dispense Refill Last Dose  . aspirin EC 81 MG tablet Take 1 tablet (81 mg total) by mouth daily. Take after 12 weeks for prevention of preeclampsia later in pregnancy 300 tablet 2 Taking  . labetalol (NORMODYNE) 200 MG tablet Take 1 tablet (200 mg total) by mouth 2 (two) times daily. 60 tablet 3 Taking  .  Prenatal Multivit-Min-Fe-FA (PRENATAL VITAMINS) 0.8 MG tablet Take 1 tablet by mouth daily. 30 tablet 12 Taking    Review of Systems  Constitutional: Negative for chills and fever.  Eyes: Negative for visual disturbance.  Respiratory: Negative for shortness of breath.   Cardiovascular: Negative for chest pain and leg swelling.  Gastrointestinal: Negative for abdominal pain, constipation, diarrhea, nausea and vomiting.  Genitourinary: Positive for vaginal discharge (whitish). Negative for dysuria, hematuria and vaginal bleeding.  Neurological: Positive for headaches ("slight"). Negative for dizziness, seizures, syncope and light-headedness.   Physical Exam   Blood pressure (!) 163/97, pulse 81, temperature 97.8 F (36.6 C), temperature source Oral, resp. rate 18, height 5\' 11"  (1.803 m), weight 116.6 kg (257 lb), last menstrual period 12/11/2016.  Physical Exam  Vitals reviewed. Constitutional: She is oriented to person, place, and time. She appears well-developed and well-nourished. No distress.  HENT:  Head: Normocephalic.  Cardiovascular: Normal rate and regular rhythm.  Respiratory: Effort normal. No respiratory distress.  GI: Normal appearance. She exhibits no ascites and no mass.  Gravid  Neurological: She is alert and oriented to person, place, and time.  Skin: Skin is warm and dry.  Psychiatric: She has a normal mood and affect.      Results for orders placed or performed during  the hospital encounter of 08/08/17 (from the past 24 hour(s))  Protein / creatinine ratio, urine     Status: None   Collection Time: 08/08/17  3:25 PM  Result Value Ref Range   Creatinine, Urine 252.00 mg/dL   Total Protein, Urine 20 mg/dL   Protein Creatinine Ratio 0.08 0.00 - 0.15 mg/mg[Cre]  CBC     Status: Abnormal   Collection Time: 08/08/17  4:00 PM  Result Value Ref Range   WBC 5.8 4.0 - 10.5 K/uL   RBC 3.56 (L) 3.87 - 5.11 MIL/uL   Hemoglobin 10.5 (L) 12.0 - 15.0 g/dL   HCT 65.731.5 (L)  84.636.0 - 46.0 %   MCV 88.5 78.0 - 100.0 fL   MCH 29.5 26.0 - 34.0 pg   MCHC 33.3 30.0 - 36.0 g/dL   RDW 96.214.1 95.211.5 - 84.115.5 %   Platelets 188 150 - 400 K/uL  Comprehensive metabolic panel     Status: Abnormal   Collection Time: 08/08/17  4:00 PM  Result Value Ref Range   Sodium 136 135 - 145 mmol/L   Potassium 3.9 3.5 - 5.1 mmol/L   Chloride 106 101 - 111 mmol/L   CO2 18 (L) 22 - 32 mmol/L   Glucose, Bld 76 65 - 99 mg/dL   BUN 9 6 - 20 mg/dL   Creatinine, Ser 3.240.67 0.44 - 1.00 mg/dL   Calcium 9.3 8.9 - 40.110.3 mg/dL   Total Protein 6.9 6.5 - 8.1 g/dL   Albumin 3.3 (L) 3.5 - 5.0 g/dL   AST 24 15 - 41 U/L   ALT 18 14 - 54 U/L   Alkaline Phosphatase 88 38 - 126 U/L   Total Bilirubin 0.4 0.3 - 1.2 mg/dL   GFR calc non Af Amer >60 >60 mL/min   GFR calc Af Amer >60 >60 mL/min   Anion gap 12 5 - 15    MAU Course  Procedures MDM - Severe pressures, initiated protocol: received x3 doses of Labetolol due to continued elevated BP. Remains asymptomatic.  Consulted admitting Dr Earlene Plateravis who advised to admit due to continued elevated pressures.  - Pre-eclampsia work up (negative proteinuria, PCR 0.08, platelets WNL, transaminases WNL)   Assessment and Plan  cHTN with superimposed pre-eclampsia: Admit. Initiate betamethasone.    Laura Mcneil 08/08/2017, 5:44 PM   I confirm that I have verified the information documented in the student's note and that I have also personally reperformed the physical exam and all medical decision making activities.  Donette LarryBhambri, Thekla Colborn, CNM  08/08/2017 7:39 PM

## 2017-08-08 NOTE — Progress Notes (Signed)
Faculty Note  Patient reports feeling slightly better, headache improved but not gone. No visual disturbance currently. She is feeling fetal movement, denies contractions although she does feel pressure with her bladder filling.  BP (!) 168/97   Pulse (!) 101   Temp 98.1 F (36.7 C)   Resp 16   Ht 5\' 11"  (1.803 m)   Wt 257 lb (116.6 kg)   LMP 12/11/2016 (Approximate)   SpO2 99%   BMI 35.84 kg/m   Gen: alert, oriented Abd: soft, non-tender  Bedside US: vtx  FHR: 150s, moderate variability, accels present, no decels Toco: irregular ctx  31 yo G2P0010 @ 2157w1d with cHTN on labetalol with superimposed pre-eclampsia with severe features based on severe range BP. BP significantly elevated since arrival with periodic improvements to non-severe range. She is now s/p labetalol IV 20/40/80 and hydralazine IV 10/10. BP back in severe range, will give procardia 10 mg immediate release and start MgSO4.  She received first dose one BTMZ in MAU at 1815. Goal is to stabilize BP and get her 48 hrs of antenatal corticosteroids. Will cont to monitor BP, if no improvement, will move toward delivery. Reviewed that neonate is premature with risks of prematurity and will require NICU admission. Reviewed etiology and prognosis of pre-eclampsia with concern for progression of worsening disease given resistant BP. Reviewed that she will most likely require premature delivery. She verbalizes understanding of the above and is agreeable to whatever we think is appropriate for her. Reviewed there is elevated census in NICU and if she requires delivery over the course of the night, that baby would be stabilized and transferred to another hospital. Patient is not currently stable for transfer.   NICU aware Neonatology consultation MgSO4 Procardia 10 mg PO IVFs  NPO for now    K. Therese SarahMeryl Jemell Town, M.D. Attending Obstetrician & Gynecologist, [facility], [facility]

## 2017-08-08 NOTE — MAU Note (Signed)
Pt sent to MAU from clinic with elevated BP, has slight HA, denies visual changes or epigastric pain.  Denies contractions, LOF, or bleeding.

## 2017-08-08 NOTE — Progress Notes (Addendum)
Prenatal Visit Note Date: 08/08/2017 Clinic: [facility]  Subjective:  Laura Mcneil is a 31 y.o. G2P0010 at 4568w1d being seen today for ongoing prenatal care.  She is currently monitored for the following issues for this high-risk pregnancy and has Chronic hypertension affecting pregnancy; Supervision of high-risk pregnancy; and Obesity in pregnancy on their problem list.  Patient reports no complaints, no s/s or pre-x Contractions: Not present. Vag. Bleeding: None.  Movement: Absent. Denies leaking of fluid.   The following portions of the patient's history were reviewed and updated as appropriate: allergies, current medications, past family history, past medical history, past social history, past surgical history and problem list. Problem list updated.  Objective:   Vitals:   08/08/17 1401 08/08/17 1406  BP: (!) 167/106 (!) 166/96  Pulse: 96   Weight: 256 lb 11.2 oz (116.4 kg)     Fetal Status: Fetal Heart Rate (bpm): 155   Movement: Absent     General:  Alert, oriented and cooperative. Patient is in no acute distress.  Skin: Skin is warm and dry. No rash noted.   Cardiovascular: Normal heart rate noted  Respiratory: Normal respiratory effort, no problems with respiration noted  Abdomen: Soft, gravid, appropriate for gestational age. Pain/Pressure: Absent     Pelvic:  Cervical exam deferred        Extremities: Normal range of motion.  Edema: None  Mental Status: Normal mood and affect. Normal behavior. Normal judgment and thought content.  1+ brachial b/l ctab Normal s1 and s2 Abd: nttp, gravid.  Urinalysis: Urine Protein: Negative Urine Glucose: Negative  Assessment and Plan:  Pregnancy: G2P0010 at 10668w1d  1. Chronic hypertension affecting pregnancy Taking labetalol 200/200 at 9 and 5 and took it today. States bp has been creeping up at home. No s/s of pre-x. Recommend ob triage evaluation since pt needs ap testing (at least an nst and hopefully they  can do an AFI) since she no show'ed to 30wk visit where 32wk ap testing would've been set up. Pt didn't bring bp med today. Will leave final determination of bp meds to ob triage but likely can just increase to 300 bid. Will add on bpp to 12/12 mfm growth u/s visit and have pt back for nst and rob then too. Continue low dose asa.  - US FETAL BPP WO NON STRESS; Future  2. Supervision of high risk pregnancy in third trimester See above. D/w pt re: bc nv.  - US FETAL BPP WO NON STRESS; Future  Preterm labor symptoms and general obstetric precautions including but not limited to vaginal bleeding, contractions, leaking of fluid and fetal movement were reviewed in detail with the patient. Please refer to After Visit Summary for other counseling recommendations.  Return in about 6 days (around 08/14/2017) for nst and rob visit. [place].   Sherian Valenza, MD

## 2017-08-08 NOTE — Progress Notes (Signed)
Patient taken upstairs to MAU

## 2017-08-08 NOTE — MAU Provider Note (Signed)
History     CSN: [id number]  Arrival date and time: 08/08/17 1503   First Provider Initiated Contact with Patient 08/08/17 1550      Chief Complaint  Patient presents with  . Hypertension   G2P0010 @33 .1 wks sent from office for worsening HTN. She denies visual distuebances, and epigastric pain. Had a mild HA earlier that resolved w/o meds. Reports good FM. No VB, LOF, or ctx. [facility]), and obesity.   OB History    Gravida Para Term Preterm AB Living   2       1 0   SAB TAB Ectopic Multiple Live Births                  Past Medical History:  Diagnosis Date  . Hypertension     Past Surgical History:  Procedure Laterality Date  . NO PAST SURGERIES      Family History  Problem Relation Age of Onset  . Hypertension Mother   . Cancer Mother     Social History   Tobacco Use  . Smoking status: Never Smoker  . Smokeless tobacco: Never Used  Substance Use Topics  . Alcohol use: No    Comment: occ  . Drug use: No    Allergies: No Known Allergies  Medications Prior to Admission  Medication Sig Dispense Refill Last Dose  . aspirin EC 81 MG tablet Take 1 tablet (81 mg total) by mouth daily. Take after 12 weeks for prevention of preeclampsia later in pregnancy 300 tablet 2 Taking  . labetalol (NORMODYNE) 200 MG tablet Take 1 tablet (200 mg total) by mouth 2 (two) times daily. 60 tablet 3 Taking  . Prenatal Multivit-Min-Fe-FA (PRENATAL VITAMINS) 0.8 MG tablet Take 1 tablet by mouth daily. 30 tablet 12 Taking    Review of Systems  Eyes: Negative for visual disturbance.  Respiratory: Negative for shortness of breath.   Cardiovascular: Negative for chest pain.  Gastrointestinal: Negative for abdominal pain.  Neurological: Negative for headaches.   Physical Exam   Blood pressure (!) 163/97, pulse 81, temperature 97.8 F (36.6 C), temperature source Oral, resp. rate 18, height 5\' 11"  (1.803 m), weight 257  lb (116.6 kg), last menstrual period 12/11/2016.  Physical Exam  Nursing note and vitals reviewed. Constitutional: She is oriented to person, place, and time. She appears well-developed and well-nourished. No distress.  HENT:  Head: Normocephalic and atraumatic.  Neck: Normal range of motion.  Cardiovascular: Normal rate.  Respiratory: Effort normal.  Musculoskeletal: Normal range of motion.  Neurological: She is alert and oriented to person, place, and time.  Skin: Skin is warm and dry.  Psychiatric: She has a normal mood and affect.  EFM: 145 bpm, mod variability, + accels, no decels Toco: irritability  Results for orders placed or performed during the hospital encounter of 08/08/17 (from the past 24 hour(s))  Protein / creatinine ratio, urine     Status: None   Collection Time: 08/08/17  3:25 PM  Result Value Ref Range   Creatinine, Urine 252.00 mg/dL   Total Protein, Urine 20 mg/dL   Protein Creatinine Ratio 0.08 0.00 - 0.15 mg/mg[Cre]  CBC     Status: Abnormal   Collection Time: 08/08/17  4:00 PM  Result Value Ref Range   WBC 5.8 4.0 - 10.5 K/uL   RBC 3.56 (L) 3.87 - 5.11 MIL/uL   Hemoglobin 10.5 (L) 12.0 - 15.0 g/dL   HCT 40.931.5 (  L) 36.0 - 46.0 %   MCV 88.5 78.0 - 100.0 fL   MCH 29.5 26.0 - 34.0 pg   MCHC 33.3 30.0 - 36.0 g/dL   RDW 86.514.1 78.411.5 - 69.615.5 %   Platelets 188 150 - 400 K/uL  Comprehensive metabolic panel     Status: Abnormal   Collection Time: 08/08/17  4:00 PM  Result Value Ref Range   Sodium 136 135 - 145 mmol/L   Potassium 3.9 3.5 - 5.1 mmol/L   Chloride 106 101 - 111 mmol/L   CO2 18 (L) 22 - 32 mmol/L   Glucose, Bld 76 65 - 99 mg/dL   BUN 9 6 - 20 mg/dL   Creatinine, Ser 2.950.67 0.44 - 1.00 mg/dL   Calcium 9.3 8.9 - 28.410.3 mg/dL   Total Protein 6.9 6.5 - 8.1 g/dL   Albumin 3.3 (L) 3.5 - 5.0 g/dL   AST 24 15 - 41 U/L   ALT 18 14 - 54 U/L   Alkaline Phosphatase 88 38 - 126 U/L   Total Bilirubin 0.4 0.3 - 1.2 mg/dL   GFR calc non Af Amer >60 >60 mL/min    GFR calc Af Amer >60 >60 mL/min   Anion gap 12 5 - 15   MAU Course  Procedures Labetalol 20, 40, 80  MDM Labs ordered and reviewed. Superimposed pre-e identified based on severe range BPs. Consult with Dr. Earlene Plateravis. Will admit.  Assessment and Plan  [redacted] weeks gestation CHTN w/ si Preeclampsia Admit to Ante BMZ NICU consult Mngt per MD  Donette LarryMelanie Mailee Klaas, CNM 08/08/2017, 6:02 PM

## 2017-08-08 NOTE — H&P (Signed)
Obstetric History and Physical  Crista Elliotnitra Maisie Fushomas is a 31 y.o. G2P0010 with IUP at 1166w1d presenting for elevated BP with headache and some visual changes, now improved.  [facility]  with [facility] of care at 16 weeks Pregnancy complications or risks: Patient Active Problem List   Diagnosis Date Noted  . Chronic hypertension with superimposed pre-eclampsia 08/08/2017  . Obesity in pregnancy 06/10/2017  . Chronic hypertension affecting pregnancy 04/03/2017  . Supervision of high-risk pregnancy 04/03/2017    Prenatal labs and studies: ABO, Rh: --/--/B POS, B POS (12/06 1801) Antibody: NEG (12/06 1801) Rubella: <0.90 (08/01 1018) RPR: Non Reactive (11/05 0900)  HBsAg: Negative (08/01 1018)  HIV:   non-reactive GBS:  2 hr Glucola  normal Genetic screening not done Anatomy US normal  Prenatal Transfer Tool    Past Medical History:  Diagnosis Date  . Hypertension     Past Surgical History:  Procedure Laterality Date  . NO PAST SURGERIES      OB History  Gravida Para Term Preterm AB Living  2       1 0  SAB TAB Ectopic Multiple Live Births               # Outcome Date GA Lbr Len/2nd Weight Sex Delivery Anes PTL Lv  2 Current           1 AB 10/04/16     TAB         Social History   Socioeconomic History  . Marital status: Single    Spouse name: None  . Number of children: None  . Years of education: None  . Highest education level: None  Social Needs  . Financial resource strain: None  . Food insecurity - worry: None  . Food insecurity - inability: None  . Transportation needs - medical: None  . Transportation needs - non-medical: None  Occupational History  . None  Tobacco Use  . Smoking status: Never Smoker  . Smokeless tobacco: Never Used  Substance and Sexual Activity  . Alcohol use: No    Comment: occ  . Drug use: No  . Sexual activity: No    Birth control/protection: None  Other Topics Concern  . None  Social History  Narrative  . None    Family History  Problem Relation Age of Onset  . Hypertension Mother   . Cancer Mother     Medications Prior to Admission  Medication Sig Dispense Refill Last Dose  . aspirin EC 81 MG tablet Take 1 tablet (81 mg total) by mouth daily. Take after 12 weeks for prevention of preeclampsia later in pregnancy 300 tablet 2 Taking  . labetalol (NORMODYNE) 200 MG tablet Take 1 tablet (200 mg total) by mouth 2 (two) times daily. 60 tablet 3 Taking  . Prenatal Multivit-Min-Fe-FA (PRENATAL VITAMINS) 0.8 MG tablet Take 1 tablet by mouth daily. 30 tablet 12 Taking    No Known Allergies  Review of Systems: Negative except for what is mentioned in HPI.  Physical Exam: BP (!) 168/97   Pulse (!) 101   Temp 98.1 F (36.7 C)   Resp 16   Ht 5\' 11"  (1.803 m)   Wt 257 lb (116.6 kg)   LMP 12/11/2016 (Approximate)   SpO2 99%   BMI 35.84 kg/m  CONSTITUTIONAL: Well-developed, well-nourished female in no acute distress.  HENT:  Normocephalic, atraumatic, External right and left ear normal. Oropharynx is clear and moist EYES: Conjunctivae and EOM are  normal. Pupils are equal, round, and reactive to light. No scleral icterus.  NECK: Normal range of motion, supple, no masses SKIN: Skin is warm and dry. No rash noted. Not diaphoretic. No erythema. No pallor. NEUROLOGIC: Alert and oriented to person, place, and time. Normal reflexes, muscle tone coordination. No cranial nerve deficit noted. PSYCHIATRIC: Normal mood and affect. Normal behavior. Normal judgment and thought content. CARDIOVASCULAR: Normal heart rate noted, regular rhythm RESPIRATORY: Effort and breath sounds normal, no problems with respiration noted ABDOMEN: Soft, nontender, nondistended, gravid. MUSCULOSKELETAL: Normal range of motion. No edema and no tenderness. 2+ distal pulses.    Pertinent Labs/Studies:   Results for orders placed or performed during the hospital encounter of 08/08/17 (from the past 24 hour(s))    Protein / creatinine ratio, urine     Status: None   Collection Time: 08/08/17  3:25 PM  Result Value Ref Range   Creatinine, Urine 252.00 mg/dL   Total Protein, Urine 20 mg/dL   Protein Creatinine Ratio 0.08 0.00 - 0.15 mg/mg[Cre]  CBC     Status: Abnormal   Collection Time: 08/08/17  4:00 PM  Result Value Ref Range   WBC 5.8 4.0 - 10.5 K/uL   RBC 3.56 (L) 3.87 - 5.11 MIL/uL   Hemoglobin 10.5 (L) 12.0 - 15.0 g/dL   HCT 16.1 (L) 09.6 - 04.5 %   MCV 88.5 78.0 - 100.0 fL   MCH 29.5 26.0 - 34.0 pg   MCHC 33.3 30.0 - 36.0 g/dL   RDW 40.9 81.1 - 91.4 %   Platelets 188 150 - 400 K/uL  Comprehensive metabolic panel     Status: Abnormal   Collection Time: 08/08/17  4:00 PM  Result Value Ref Range   Sodium 136 135 - 145 mmol/L   Potassium 3.9 3.5 - 5.1 mmol/L   Chloride 106 101 - 111 mmol/L   CO2 18 (L) 22 - 32 mmol/L   Glucose, Bld 76 65 - 99 mg/dL   BUN 9 6 - 20 mg/dL   Creatinine, Ser 7.82 0.44 - 1.00 mg/dL   Calcium 9.3 8.9 - 95.6 mg/dL   Total Protein 6.9 6.5 - 8.1 g/dL   Albumin 3.3 (L) 3.5 - 5.0 g/dL   AST 24 15 - 41 U/L   ALT 18 14 - 54 U/L   Alkaline Phosphatase 88 38 - 126 U/L   Total Bilirubin 0.4 0.3 - 1.2 mg/dL   GFR calc non Af Amer >60 >60 mL/min   GFR calc Af Amer >60 >60 mL/min   Anion gap 12 5 - 15  Type and screen [facility]     Status: None   Collection Time: 08/08/17  6:01 PM  Result Value Ref Range   ABO/RH(D) B POS    Antibody Screen NEG    Sample Expiration 08/11/2017   ABO/Rh     Status: None   Collection Time: 08/08/17  6:01 PM  Result Value Ref Range   ABO/RH(D) B POS     Assessment : Teosha Casso is a 31 y.o. G2P0010 at [redacted]w[redacted]d being admitted for chronic hypertension with superimposed PEC. Normal lab work. BP resistant to 2 agents. Will start MgSO4 and PO procardia immediate release. Goal is to stabilize BP and get her 48 hrs of steroids, however if BP not improved, will move towards delivery. She is vertex. She verbalizes  understanding of the above. NICU census elevated, if requires delivery while census is full, will stabilize baby and transfer to another  facility. Patient verbalizes understanding of this as well. Answered all questions.  Plan:  FWB  - Cat I fetal heart tracing   - GBS pending  cHTN with superimposed PEC with severe features - MgSO4 4 g LD and 2 g/hr - po procardia - s/p labetalol 20/40/80  - s/p hydralazine 10/10 - NPO for now   K. Therese SarahMeryl Eldrige Pitkin, M.D. Attending Obstetrician & Gynecologist, [facility] for [facility], [facility]  08/08/2017, 9:28 PM

## 2017-08-09 DIAGNOSIS — O1490 Unspecified pre-eclampsia, unspecified trimester: Secondary | ICD-10-CM | POA: Insufficient documentation

## 2017-08-09 HISTORY — DX: Unspecified pre-eclampsia, unspecified trimester: O14.90

## 2017-08-09 LAB — RPR: RPR: NONREACTIVE

## 2017-08-09 LAB — RAPID HIV SCREEN (HIV 1/2 AB+AG)
HIV 1/2 Antibodies: NONREACTIVE
HIV-1 P24 ANTIGEN - HIV24: NONREACTIVE

## 2017-08-09 MED ORDER — LABETALOL HCL 100 MG PO TABS
100.0000 mg | ORAL_TABLET | Freq: Two times a day (BID) | ORAL | Status: DC
  Administered 2017-08-09 (×2): 100 mg via ORAL
  Filled 2017-08-09 (×2): qty 1

## 2017-08-09 MED ORDER — LACTATED RINGERS IV SOLN
INTRAVENOUS | Status: DC
  Administered 2017-08-09: 10:00:00 via INTRAVENOUS

## 2017-08-09 MED ORDER — HYDRALAZINE HCL 20 MG/ML IJ SOLN: 10.0000 mg | Freq: Once | INTRAMUSCULAR | Status: DC | PRN

## 2017-08-09 MED ORDER — LABETALOL HCL 5 MG/ML IV SOLN
20.0000 mg | INTRAVENOUS | Status: AC | PRN
Start: 2017-08-09 — End: 2017-08-14
  Administered 2017-08-11: 40 mg via INTRAVENOUS
  Administered 2017-08-11 – 2017-08-14 (×2): 20 mg via INTRAVENOUS
  Filled 2017-08-09: qty 8
  Filled 2017-08-09 (×2): qty 4

## 2017-08-09 NOTE — Progress Notes (Signed)
Laura Mcneil  Laura Mcneil is a 31 y.o. G2P0010 at 42w2dwho is admitted for chronic HTN with superimposed pre-eclampsia with severe features.  Estimated Date of Delivery: 09/25/17 Fetal presentation is cephalic.  Length of Stay:  1 Days. Admitted 08/08/2017  Subjective:  Patient reports normal fetal movement.  She denies uterine contractions, denies  bleeding and leaking of fluid per vagina. She reports headache is similar to what it has been, otherwise feeling okay.  Vitals:  Blood pressure (!) 144/95, pulse (!) 101, temperature 98.2 F (36.8 C), temperature source Oral, resp. rate 18, height _0  (1.803 m), weight 257 lb (116.6 kg), last menstrual period 12/11/2016, SpO2 99 %. Physical Examination: CONSTITUTIONAL: Well-developed, well-nourished female in no acute distress.  HENT:  Normocephalic, atraumatic, External right and left ear normal. Oropharynx is clear and moist EYES: Conjunctivae and EOM are normal. Pupils are equal, round, and reactive to light. No scleral icterus.  NECK: Normal range of motion, supple, no masses. SKIN: Skin is warm and dry. No rash noted. Not diaphoretic. No erythema. No pallor. [place] and oriented to person, place, and time. Normal reflexes, muscle tone coordination. No cranial nerve deficit noted. PSYCHIATRIC: Normal mood and affect. Normal behavior. Normal judgment and thought content. CARDIOVASCULAR: Normal heart rate noted, regular rhythm RESPIRATORY: Effort and breath sounds normal, no problems with respiration noted MUSCULOSKELETAL: Normal range of motion. No edema and no tenderness. 1/4 reflexes ABDOMEN: Soft, nontender, nondistended, gravid. CERVIX: Dilation: Fingertip Effacement (%): Thick Cervical Position: Posterior Presentation: Undeterminable Exam by:: Dr DRosana Hoes  Fetal monitoring: FHR: 130s bpm, Variability: moderate, Accelerations: Present, Decelerations: Absent  Uterine activity: regular ctx Q 3-5  min  Results for orders placed or performed during the hospital encounter of 08/08/17 (from the past 48 hour(s))  Protein / creatinine ratio, urine     Status: None   Collection Time: 08/08/17  3:25 PM  Result Value Ref Range   Creatinine, Urine 252.00 mg/dL   Total Protein, Urine 20 mg/dL    Comment: NO NORMAL RANGE ESTABLISHED FOR THIS TEST   Protein Creatinine Ratio 0.08 0.00 - 0.15 mg/mg[Cre]  CBC     Status: Abnormal   Collection Time: 08/08/17  4:00 PM  Result Value Ref Range   WBC 5.8 4.0 - 10.5 K/uL   RBC 3.56 (L) 3.87 - 5.11 MIL/uL   Hemoglobin 10.5 (L) 12.0 - 15.0 g/dL   HCT 31.5 (L) 36.0 - 46.0 %   MCV 88.5 78.0 - 100.0 fL   MCH 29.5 26.0 - 34.0 pg   MCHC 33.3 30.0 - 36.0 g/dL   RDW 14.1 11.5 - 15.5 %   Platelets 188 150 - 400 K/uL  Comprehensive metabolic panel     Status: Abnormal   Collection Time: 08/08/17  4:00 PM  Result Value Ref Range   Sodium 136 135 - 145 mmol/L   Potassium 3.9 3.5 - 5.1 mmol/L   Chloride 106 101 - 111 mmol/L   CO2 18 (L) 22 - 32 mmol/L   Glucose, Bld 76 65 - 99 mg/dL   BUN 9 6 - 20 mg/dL   Creatinine, Ser 0.67 0.44 - 1.00 mg/dL   Calcium 9.3 8.9 - 10.3 mg/dL   Total Protein 6.9 6.5 - 8.1 g/dL   Albumin 3.3 (L) 3.5 - 5.0 g/dL   AST 24 15 - 41 U/L   ALT 18 14 - 54 U/L   Alkaline Phosphatase 88 38 - 126 U/L   Total Bilirubin 0.4  0.3 - 1.2 mg/dL   GFR calc non Af Amer >60 >60 mL/min   GFR calc Af Amer >60 >60 mL/min    Comment: (Mcneil) The eGFR has been calculated using the CKD EPI equation. This calculation has not been validated in all clinical situations. eGFR's persistently <60 mL/min signify possible Chronic Kidney Disease.    Anion gap 12 5 - 15  Rapid HIV screen (HIV 1/2 Ab+Ag) ([facility] Only)     Status: None   Collection Time: 08/08/17  4:00 PM  Result Value Ref Range   HIV-1 P24 Antigen - HIV24 NON REACTIVE NON REACTIVE   HIV 1/2 Antibodies NON REACTIVE NON REACTIVE   Interpretation (HIV Ag Ab)      A non reactive test  result means that HIV 1 or HIV 2 antibodies and HIV 1 p24 antigen were not detected in the specimen.  Type and screen [place]     Status: None   Collection Time: 08/08/17  6:01 PM  Result Value Ref Range   ABO/RH(D) B POS    Antibody Screen NEG    Sample Expiration 08/11/2017   ABO/Rh     Status: None   Collection Time: 08/08/17  6:01 PM  Result Value Ref Range   ABO/RH(D) B POS     No results found.  Current scheduled medications . betamethasone acetate-betamethasone sodium phosphate  12 mg Intramuscular Q24H  . docusate sodium  100 mg Oral Daily  . prenatal multivitamin  1 tablet Oral Q1200  . sodium chloride flush  3 mL Intravenous Q12H    I have reviewed the patient's current medications.  ASSESSMENT: Active Problems:   Chronic hypertension with superimposed pre-eclampsia   Pre-eclampsia   PLAN: Cont MgSO4 BTMZ#2 due today 1815 Will plan for induction of labor after steroid complete Patient aware that infant may require transfer due to census  NICU consult today   Continue routine antenatal care.   Feliz Beam, M.D. Attending [place], [facility] for [employer], [place]

## 2017-08-09 NOTE — Progress Notes (Signed)
Patient ID: Laura Mcneil, female   DOB: [birth year], 31 y.o.   MRN: [id number] FACULTY PRACTICE ANTEPARTUM(COMPREHENSIVE) NOTE  Laura Mcneil is a 31 y.o. G2P0010 at 2077w2d  who is admitted for chronic HTN with superimposed pre-eclampsia    Fetal presentation is cephalic. Length of Stay:  1  Days  Subjective: No headache Patient reports the fetal movement as active. Patient reports uterine contraction  activity as none. Patient reports  vaginal bleeding as none. Patient describes fluid per vagina as None.  Vitals:  Blood pressure 130/83, pulse (!) 105, temperature 98.4 F (36.9 C), temperature source Oral, resp. rate 18, height 5\' 11"  (1.803 m), weight 257 lb (116.6 kg), last menstrual period 12/11/2016, SpO2 99 %. Physical Examination:  General appearance - alert, well appearing, and in no distress Heart - normal rate and regular rhythm Abdomen - soft, nontender, nondistended Fundal Height:  size equals dates Cervical Exam: Not evaluated. Extremities: extremities normal, atraumatic, no cyanosis or edema and Homans sign is negative, no sign of DVT Membranes:intact Fetal Heart Rate A  Mode External filed at 08/09/2017 1305  Baseline Rate (A) 140 bpm filed at 08/09/2017 1305  Variability 6-25 BPM filed at 08/09/2017 1305  Accelerations 15 x 15 filed at 08/09/2017 1305      Labs:  Results for orders placed or performed during the hospital encounter of 08/08/17 (from the past 24 hour(s))  Protein / creatinine ratio, urine   Collection Time: 08/08/17  3:25 PM  Result Value Ref Range   Creatinine, Urine 252.00 mg/dL   Total Protein, Urine 20 mg/dL   Protein Creatinine Ratio 0.08 0.00 - 0.15 mg/mg[Cre]  CBC   Collection Time: 08/08/17  4:00 PM  Result Value Ref Range   WBC 5.8 4.0 - 10.5 K/uL   RBC 3.56 (L) 3.87 - 5.11 MIL/uL   Hemoglobin 10.5 (L) 12.0 - 15.0 g/dL   HCT 28.431.5 (L) 13.236.0 - 44.046.0 %   MCV 88.5 78.0 - 100.0 fL   MCH 29.5 26.0 - 34.0 pg   MCHC 33.3 30.0 - 36.0 g/dL    RDW 10.214.1 72.511.5 - 36.615.5 %   Platelets 188 150 - 400 K/uL  Comprehensive metabolic panel   Collection Time: 08/08/17  4:00 PM  Result Value Ref Range   Sodium 136 135 - 145 mmol/L   Potassium 3.9 3.5 - 5.1 mmol/L   Chloride 106 101 - 111 mmol/L   CO2 18 (L) 22 - 32 mmol/L   Glucose, Bld 76 65 - 99 mg/dL   BUN 9 6 - 20 mg/dL   Creatinine, Ser 4.400.67 0.44 - 1.00 mg/dL   Calcium 9.3 8.9 - 34.710.3 mg/dL   Total Protein 6.9 6.5 - 8.1 g/dL   Albumin 3.3 (L) 3.5 - 5.0 g/dL   AST 24 15 - 41 U/L   ALT 18 14 - 54 U/L   Alkaline Phosphatase 88 38 - 126 U/L   Total Bilirubin 0.4 0.3 - 1.2 mg/dL   GFR calc non Af Amer >60 >60 mL/min   GFR calc Af Amer >60 >60 mL/min   Anion gap 12 5 - 15  Rapid HIV screen (HIV 1/2 Ab+Ag) ([facility] Only)   Collection Time: 08/08/17  4:00 PM  Result Value Ref Range   HIV-1 P24 Antigen - HIV24 NON REACTIVE NON REACTIVE   HIV 1/2 Antibodies NON REACTIVE NON REACTIVE   Interpretation (HIV Ag Ab)      A non reactive test result means that HIV 1 or HIV 2 antibodies  and HIV 1 p24 antigen were not detected in the specimen.  Type and screen [facility] [place]   Collection Time: 08/08/17  6:01 PM  Result Value Ref Range   ABO/RH(D) B POS    Antibody Screen NEG    Sample Expiration 08/11/2017   ABO/Rh   Collection Time: 08/08/17  6:01 PM  Result Value Ref Range   ABO/RH(D) B POS   Results for orders placed or performed in visit on 08/08/17 (from the past 24 hour(s))  POCT urinalysis dip (device)   Collection Time: 08/08/17  1:58 PM  Result Value Ref Range   Glucose, UA NEGATIVE NEGATIVE mg/dL   Bilirubin Urine NEGATIVE NEGATIVE   Ketones, ur TRACE (A) NEGATIVE mg/dL   Specific Gravity, Urine >=1.030 1.005 - 1.030   Hgb urine dipstick NEGATIVE NEGATIVE   pH 6.0 5.0 - 8.0   Protein, ur NEGATIVE NEGATIVE mg/dL   Urobilinogen, UA 1.0 0.0 - 1.0 mg/dL   Nitrite NEGATIVE NEGATIVE   Leukocytes, UA SMALL (A) NEGATIVE      Medications:  Scheduled .  betamethasone acetate-betamethasone sodium phosphate  12 mg Intramuscular Q24H  . docusate sodium  100 mg Oral Daily  . labetalol  100 mg Oral BID  . prenatal multivitamin  1 tablet Oral Q1200  . sodium chloride flush  3 mL Intravenous Q12H   I have reviewed the patient's current medications.  ASSESSMENT: Patient Active Problem List   Diagnosis Date Noted  . Pre-eclampsia 08/09/2017  . Chronic hypertension with superimposed pre-eclampsia 08/08/2017  . Obesity in pregnancy 06/10/2017  . Chronic hypertension affecting pregnancy 04/03/2017  . Supervision of high-risk pregnancy 04/03/2017    PLAN: BP is under control on labetalol 100 mg PO BID. She will be transferred to the High risk ob unit.  Scheryl DarterJames Barbie Croston 08/09/2017,1:31 PM

## 2017-08-09 NOTE — Progress Notes (Signed)
Orders rec.  To begin with oral labetalol per Dr Debroah LoopArnold

## 2017-08-09 NOTE — Consult Note (Signed)
[facility]  Prenatal Consult       08/09/2017  8:09 AM   I was asked by Dr. Earlene Plateravis to consult on this patient for possible preterm delivery.  I had the pleasure of meeting with Ms. Westley today.  She is a 31 y/o G2P0010 at 233 and 2/[redacted] weeks gestation who was admitted yesterday for chronic hypertension with superimposed pre-eclampsia.  She received betamethasone yesterday and will have a 2nd dose today.  Blood pressures did not improve with labetalol and hydralazine so she is now on magnesium.  The fetus is female and well appearing.  She will likely be induced over the next few days once steroids are complete.    I explained that the neonatal intensive care team would be present for the delivery and outlined the likely delivery room course for this baby including routine resuscitation and NRP-guided approaches to the treatment of respiratory distress. We discussed other common problems associated with prematurity including respiratory distress syndrome/CLD, apnea, feeding issues, temperature regulation, and infection risk.    We discussed the average length of stay but I noted that the actual LOS would depend on the severity of problems encountered and response to treatments.  We discussed visitation policies and the resources available while her child is in the hospital.  We discussed the importance of good nutrition and various methods of providing nutrition (parenteral hyperalimentation, gavage feedings and/or oral feeding). We discussed the benefits of human milk. I encouraged breast feeding and pumping soon after birth and outlined resources that are available to support breast feeding.   Thank you for involving us in the care of this patient. A member of our team will be available should the family have additional questions.  Time for consultation approximately 45 minutes.   _____________________ Electronically Signed By: Maryan CharLindsey Jathan Balling, MD Neonatologist

## 2017-08-10 MED ORDER — LABETALOL HCL 200 MG PO TABS
200.0000 mg | ORAL_TABLET | Freq: Once | ORAL | Status: AC
  Administered 2017-08-10: 200 mg via ORAL
  Filled 2017-08-10: qty 1

## 2017-08-10 MED ORDER — FLUCONAZOLE 150 MG PO TABS
150.0000 mg | ORAL_TABLET | Freq: Once | ORAL | Status: AC
  Administered 2017-08-10: 150 mg via ORAL
  Filled 2017-08-10: qty 1

## 2017-08-10 MED ORDER — LABETALOL HCL 200 MG PO TABS
200.0000 mg | ORAL_TABLET | Freq: Two times a day (BID) | ORAL | Status: DC
  Administered 2017-08-10: 200 mg via ORAL
  Filled 2017-08-10: qty 1

## 2017-08-10 MED ORDER — LABETALOL HCL 200 MG PO TABS
400.0000 mg | ORAL_TABLET | Freq: Two times a day (BID) | ORAL | Status: DC
  Administered 2017-08-10 – 2017-08-11 (×2): 400 mg via ORAL
  Filled 2017-08-10 (×2): qty 2

## 2017-08-10 NOTE — Progress Notes (Signed)
Patient ID: Laura Mcneil, female   DOB: 05/17/1986, 31 y.o.   MRN: [id number] FACULTY PRACTICE ANTEPARTUM(COMPREHENSIVE) NOTE  Laura Mcneil is a 31 y.o. G2P0010 at 883w3d who is admitted for chronic HTN with superimposed pre-eclampsia    Fetal presentation is cephalic. Length of Stay:  2  Days  Subjective:  Patient reports the fetal movement as active. Patient reports uterine contraction  activity as none. Patient reports  vaginal bleeding as none. Patient describes fluid per vagina as None.  Vitals:  Blood pressure (!) 145/88, pulse 94, temperature 98.6 F (37 C), temperature source Oral, resp. rate 16, height 5\' 11"  (1.803 m), weight 254 lb (115.2 kg), last menstrual period 12/11/2016, SpO2 100 %. Physical Examination:  General appearance - alert, well appearing, and in no distress Heart - normal rate and regular rhythm Abdomen - soft, nontender, nondistended Fundal Height:  size equals dates Cervical Exam: Not evaluated.  Extremities: extremities normal, atraumatic, no cyanosis or edema and Homans sign is negative, no sign of DVT Membranes:intact  Fetal Monitoring:     Fetal Heart Rate A  Mode External filed at 08/09/2017 2215  Baseline Rate (A) 135 bpm filed at 08/09/2017 2230  Variability 6-25 BPM filed at 08/09/2017 2230  Accelerations 15 x 15 filed at 08/09/2017 2230  Decelerations Variable filed at 08/09/2017 2215     Labs:  No results found for this or any previous visit (from the past 24 hour(s)).   Medications:  Scheduled . docusate sodium  100 mg Oral Daily  . labetalol  100 mg Oral BID  . prenatal multivitamin  1 tablet Oral Q1200  . sodium chloride flush  3 mL Intravenous Q12H   I have reviewed the patient's current medications.  ASSESSMENT: Patient Active Problem List   Diagnosis Date Noted  . Pre-eclampsia 08/09/2017  . Chronic hypertension with superimposed pre-eclampsia 08/08/2017  . Obesity in pregnancy 06/10/2017  . Chronic hypertension  affecting pregnancy 04/03/2017  . Supervision of high-risk pregnancy 04/03/2017   BP is elevated but not severe range PLAN: Increase to 200 mg labetalol BID  Scheryl DarterJames Arnold 08/10/2017,7:11 AM

## 2017-08-11 ENCOUNTER — Encounter: Payer: Self-pay | Admitting: Certified Nurse Midwife

## 2017-08-11 DIAGNOSIS — O99820 Streptococcus B carrier state complicating pregnancy: Secondary | ICD-10-CM

## 2017-08-11 HISTORY — DX: Streptococcus B carrier state complicating pregnancy: O99.820

## 2017-08-11 LAB — CBC
HEMATOCRIT: 31.7 % — AB (ref 36.0–46.0)
HEMOGLOBIN: 10.4 g/dL — AB (ref 12.0–15.0)
MCH: 29.5 pg (ref 26.0–34.0)
MCHC: 32.8 g/dL (ref 30.0–36.0)
MCV: 89.8 fL (ref 78.0–100.0)
Platelets: 182 10*3/uL (ref 150–400)
RBC: 3.53 MIL/uL — ABNORMAL LOW (ref 3.87–5.11)
RDW: 14.8 % (ref 11.5–15.5)
WBC: 6.4 10*3/uL (ref 4.0–10.5)

## 2017-08-11 LAB — COMPREHENSIVE METABOLIC PANEL
ALK PHOS: 71 U/L (ref 38–126)
ALT: 21 U/L (ref 14–54)
ANION GAP: 9 (ref 5–15)
AST: 19 U/L (ref 15–41)
Albumin: 3 g/dL — ABNORMAL LOW (ref 3.5–5.0)
BUN: 10 mg/dL (ref 6–20)
CALCIUM: 8.7 mg/dL — AB (ref 8.9–10.3)
CO2: 19 mmol/L — ABNORMAL LOW (ref 22–32)
CREATININE: 0.62 mg/dL (ref 0.44–1.00)
Chloride: 108 mmol/L (ref 101–111)
Glucose, Bld: 87 mg/dL (ref 65–99)
Potassium: 3.6 mmol/L (ref 3.5–5.1)
Sodium: 136 mmol/L (ref 135–145)
TOTAL PROTEIN: 6.6 g/dL (ref 6.5–8.1)
Total Bilirubin: 0.3 mg/dL (ref 0.3–1.2)

## 2017-08-11 LAB — CULTURE, BETA STREP (GROUP B ONLY)

## 2017-08-11 LAB — PROTEIN / CREATININE RATIO, URINE
CREATININE, URINE: 80 mg/dL
Protein Creatinine Ratio: 0.11 mg/mg{Cre} (ref 0.00–0.15)
Total Protein, Urine: 9 mg/dL

## 2017-08-11 MED ORDER — LABETALOL HCL 200 MG PO TABS
400.0000 mg | ORAL_TABLET | Freq: Three times a day (TID) | ORAL | Status: DC
  Administered 2017-08-11 – 2017-08-12 (×3): 400 mg via ORAL
  Filled 2017-08-11 (×3): qty 2

## 2017-08-11 NOTE — Progress Notes (Signed)
FACULTY PRACTICE ANTEPARTUM(COMPREHENSIVE) NOTE  Laura Mcneil is a 31 y.o. G2P0010 at 371w4d  [facility], rule out SIPE.Marland Kitchen.   Fetal presentation is cephalic. Length of Stay:  3  Days  Subjective: Pt has had an atypical course, with CHTN dx at first trimester, reduced bp med requirements second trimester, and now having progressing pressures in to severe range resulting In this admission.  Today she has had persistent elevations in 150-160's/90's despite increasing to 400 mg bid of Labetalol yesterday. Placed on 400 tid this am, and PIH labs repeated, pt still denies h/a, scotoma , ruq pain or vision changes Patient reports the fetal movement as active. Patient reports uterine contraction  activity as none. Patient reports  vaginal bleeding as none. Patient describes fluid per vagina as None.  Vitals:  Blood pressure (!) 158/92, pulse 73, temperature 98.6 F (37 C), temperature source Oral, resp. rate 18, height 5\' 11"  (1.803 m), weight 252 lb 8 oz (114.5 kg), last menstrual period 12/11/2016, SpO2 100 %. Physical Examination:  General appearance - alert, well appearing, and in no distress, overweight and well hydrated Heart - normal rate and regular rhythm Abdomen - soft, nontender, nondistended Fundal Height:  size equals dates Cervical Exam: Not evaluated. and found to be / / and fetal presentation is cephalic. Extremities: extremities normal, atraumatic, no cyanosis or edema and Homans sign is negative, no sign of DVT with DTRs 2+ bilaterally Membranes:intact  Fetal Monitoring:  Baseline: 140 bpm, Variability: Good {> 6 bpm), Accelerations: Reactive and Decelerations: Absent last 24 hours personally reviewed  Labs:  Results for orders placed or performed during the hospital encounter of 08/08/17 (from the past 24 hour(s))  CBC   Collection Time: 08/11/17  8:42 AM  Result Value Ref Range   WBC 6.4 4.0 - 10.5 K/uL   RBC 3.53 (L) 3.87 - 5.11  MIL/uL   Hemoglobin 10.4 (L) 12.0 - 15.0 g/dL   HCT 16.131.7 (L) 09.636.0 - 04.546.0 %   MCV 89.8 78.0 - 100.0 fL   MCH 29.5 26.0 - 34.0 pg   MCHC 32.8 30.0 - 36.0 g/dL   RDW 40.914.8 81.111.5 - 91.415.5 %   Platelets 182 150 - 400 K/uL    Imaging Studies:     Currently EPIC will not allow sonographic studies to automatically populate into notes.  In the meantime, copy and paste results into note or free text.  Medications:  Scheduled . docusate sodium  100 mg Oral Daily  . labetalol  400 mg Oral TID  . prenatal multivitamin  1 tablet Oral Q1200  . sodium chloride flush  3 mL Intravenous Q12H   I have reviewed the patient's current medications.  ASSESSMENT: Patient Active Problem List   Diagnosis Date Noted  . Pre-eclampsia 08/09/2017  . Chronic hypertension with superimposed pre-eclampsia 08/08/2017  . Obesity in pregnancy 06/10/2017  . Chronic hypertension affecting pregnancy 04/03/2017  . Supervision of high-risk pregnancy 04/03/2017    PLAN: 1.  Cancel consideration of discharge home continued inpatient care 2.  Crease labetalol to 400 mg 3 times daily 3.  Repeat PIH labs and protein creatinine ratio this a.m. 4. Laura Mcneil 08/11/2017,9:19 AM    Patient ID: Laura FoilAnitra Rossie, female   DOB: November 24, 1985, 31 y.o.   MRN: [id number]

## 2017-08-12 ENCOUNTER — Inpatient Hospital Stay (HOSPITAL_COMMUNITY): Payer: Medicaid Other

## 2017-08-12 DIAGNOSIS — O99820 Streptococcus B carrier state complicating pregnancy: Secondary | ICD-10-CM

## 2017-08-12 DIAGNOSIS — O289 Unspecified abnormal findings on antenatal screening of mother: Secondary | ICD-10-CM

## 2017-08-12 LAB — TYPE AND SCREEN
ABO/RH(D): B POS
ANTIBODY SCREEN: NEGATIVE

## 2017-08-12 MED ORDER — LABETALOL HCL 200 MG PO TABS
600.0000 mg | ORAL_TABLET | Freq: Three times a day (TID) | ORAL | Status: DC
  Administered 2017-08-12 – 2017-08-15 (×8): 600 mg via ORAL
  Filled 2017-08-12 (×2): qty 3
  Filled 2017-08-12: qty 6
  Filled 2017-08-12 (×2): qty 3
  Filled 2017-08-12: qty 6
  Filled 2017-08-12 (×3): qty 3

## 2017-08-12 NOTE — Progress Notes (Signed)
Faculty Note  Called by RN to review tracing, two late decels noted with otherwise reactive FHT. BPP ordered, received call from tech that BPP was 6/8, though awaiting final read by MFM. Overall, BPP 8/10 with -2 pts for breathing. Will keep patient on continuous monitoring overnight and cont to monitor for worsening condition.    Baldemar LenisK. Meryl Luverta Korte, M.D. Attending Obstetrician & Gynecologist, [facility], [facility]

## 2017-08-12 NOTE — Progress Notes (Signed)
Patient ID: Laura Foilnitra Alberico, female   DOB: [birth year], 31 y.o.   MRN: [id number]  FACULTY PRACTICE ANTEPARTUM NOTE  Laura Mcneil is a 31 y.o. G2P0010 at 5910w5d  who is admitted for elevated BP.   Fetal presentation is cephalic. Length of Stay:  4  Days  Subjective: Patient without complaint - no headache, blurred vision, n/v. Patient reports good fetal movement.   She reports no uterine contractions She reports no bleeding  She reports no loss of fluid per vagina.  Vitals:  Blood pressure (!) 154/92, pulse 80, temperature 98.3 F (36.8 C), temperature source Oral, resp. rate 18, height 5\' 11"  (1.803 m), weight 250 lb 8 oz (113.6 kg), last menstrual period 12/11/2016, SpO2 98 %. Physical Examination:  General appearance - alert, well appearing, and in no distress Chest - clear to auscultation, no wheezes, rales or rhonchi, symmetric air entry Heart - normal rate, regular rhythm, normal S1, S2, no murmurs, rubs, clicks or gallops Abdomen - soft, nontender, nondistended, no masses or organomegaly Fundal Height:  size equals dates Extremities: extremities normal, atraumatic, no cyanosis or edema, Homans sign is negative, no sign of DVT and no edema, redness or tenderness in the calves or thighs  Membranes:intact  Fetal Monitoring:  Baseline: 130-140 bpm, Variability: Good {> 6 bpm), Accelerations: Reactive and Decelerations: Absent x3  Labs:  No results found for this or any previous visit (from the past 24 hour(s)).  Imaging Studies:       Medications:  Scheduled . docusate sodium  100 mg Oral Daily  . labetalol  400 mg Oral TID  . prenatal multivitamin  1 tablet Oral Q1200  . sodium chloride flush  3 mL Intravenous Q12H   I have reviewed the patient's current medications.  ASSESSMENT: Active Problems:   Chronic hypertension with superimposed pre-eclampsia   GBS (group B Streptococcus carrier), +RV culture, currently pregnant   PLAN: 1. CHTN with superimposed severe  preeclampsia  Had some severe range BP yesterday, requiring IV meds.  BMZ given 12/6 and 12/7  Labetalol at 400mg  TID - will increase to 600mg  TID  Labs stable  Will induce at 34 weeks.  NST reactive x3 2. GBS  Intrapartum prophylaxis.  Continue routine antenatal care.   Levie HeritageStinson, Telina Kleckley J, DO 08/12/2017,11:42 AM

## 2017-08-12 NOTE — Progress Notes (Signed)
To ultrasound via w/c.

## 2017-08-13 DIAGNOSIS — O289 Unspecified abnormal findings on antenatal screening of mother: Secondary | ICD-10-CM

## 2017-08-13 DIAGNOSIS — O288 Other abnormal findings on antenatal screening of mother: Secondary | ICD-10-CM

## 2017-08-13 NOTE — Progress Notes (Signed)
Dr. Adrian BlackwaterStinson notified of BP of 163/108. Instructed to give 10:00 am Labetalol now. Will continue to monitor. No further orders given. Carmelina DaneERRI L Amenah Tucci, RN

## 2017-08-13 NOTE — Progress Notes (Signed)
Patient ID: Laura Mcneil, female   DOB: February 23, 1986, 31 y.o.   MRN: [id number]  FACULTY PRACTICE ANTEPARTUM NOTE  Laura Mcneil is a 31 y.o. G2P0010 at 7960w5d  who is admitted for elevated BP.   Fetal presentation is cephalic. Length of Stay:  5  Days  Subjective: Patient seen. Has been having increasing BP with need to increase labetalol, which is now 600mg  TID. Patient without new complaint - No headache, blurred vision, scotoma. Patient reports good fetal movement.   She reports no uterine contractions She reports no bleeding  She reports no loss of fluid per vagina.  Vitals:  Blood pressure (!) 163/108, pulse 75, temperature 98.7 F (37.1 C), temperature source Oral, resp. rate 18, height 5\' 11"  (1.803 m), weight 250 lb 8 oz (113.6 kg), last menstrual period 12/11/2016, SpO2 99 %. Physical Examination: General appearance - A&Ox3, no acute distress, resting comfortably. Chest - CTA, no wheezes, rales, rhonchi Heart -regular rate, normal S1-S2 sounds.  No murmurs, rubs, gallops. Abdomen -soft, nontender, nondistended.  Gravid. Fundal Height:  size equals dates Extremities: extremities normal, atraumatic, no cyanosis or edema, Homans sign is negative, no sign of DVT and no edema, redness or tenderness in the calves or thighs  Membranes:intact  Fetal Monitoring: Had variable Patient on continuous monitoring, which was reviewed.  Deceleration around 5:00, otherwise reactive.  Labs:  Results for orders placed or performed during the hospital encounter of 08/08/17 (from the past 24 hour(s))  Type and screen [facility] [place]   Collection Time: 08/12/17  1:04 PM  Result Value Ref Range   ABO/RH(D) B POS    Antibody Screen NEG    Sample Expiration 08/15/2017     Imaging Studies:       Medications:  Scheduled . docusate sodium  100 mg Oral Daily  . labetalol  600 mg Oral TID  . prenatal multivitamin  1 tablet Oral Q1200  . sodium chloride flush  3 mL Intravenous  Q12H   I have reviewed the patient's current medications.  ASSESSMENT: Active Problems:   Chronic hypertension with superimposed pre-eclampsia   GBS (group B Streptococcus carrier), +RV culture, currently pregnant   PLAN: 1. CHTN with superimposed severe preeclampsia  BP elevated this morning prior to oral medications.  BMZ given 12/6 and 12/7  Labetalol 600mg  TID  Induce tomorrow at 34 weeks.  NST reactive 2. GBS  Intrapartum prophylaxis.  Continue routine antenatal care.   Levie HeritageStinson, Jacob J, DO 08/13/2017,9:22 AM

## 2017-08-14 ENCOUNTER — Inpatient Hospital Stay (HOSPITAL_COMMUNITY): Payer: Medicaid Other

## 2017-08-14 ENCOUNTER — Ambulatory Visit (HOSPITAL_COMMUNITY): Admission: RE | Admit: 2017-08-14 | Payer: Medicaid Other | Source: Ambulatory Visit

## 2017-08-14 ENCOUNTER — Inpatient Hospital Stay (HOSPITAL_COMMUNITY): Payer: Medicaid Other | Admitting: Anesthesiology

## 2017-08-14 LAB — COMPREHENSIVE METABOLIC PANEL
ALBUMIN: 3.3 g/dL — AB (ref 3.5–5.0)
ALK PHOS: 82 U/L (ref 38–126)
ALT: 25 U/L (ref 14–54)
ANION GAP: 11 (ref 5–15)
AST: 28 U/L (ref 15–41)
BILIRUBIN TOTAL: 1.1 mg/dL (ref 0.3–1.2)
BUN: 11 mg/dL (ref 6–20)
CALCIUM: 8.9 mg/dL (ref 8.9–10.3)
CO2: 18 mmol/L — ABNORMAL LOW (ref 22–32)
CREATININE: 0.62 mg/dL (ref 0.44–1.00)
Chloride: 105 mmol/L (ref 101–111)
GFR calc Af Amer: >60 mL/min (ref >60)
GFR calc non Af Amer: >60 mL/min (ref >60)
GLUCOSE: 72 mg/dL (ref 65–99)
Potassium: 4.3 mmol/L (ref 3.5–5.1)
Sodium: 134 mmol/L — ABNORMAL LOW (ref 135–145)
TOTAL PROTEIN: 7.5 g/dL (ref 6.5–8.1)

## 2017-08-14 LAB — CBC
HCT: 35.9 % — ABNORMAL LOW (ref 36.0–46.0)
HEMATOCRIT: 34.9 % — AB (ref 36.0–46.0)
HEMOGLOBIN: 11.3 g/dL — AB (ref 12.0–15.0)
HEMOGLOBIN: 11.7 g/dL — AB (ref 12.0–15.0)
MCH: 28.8 pg (ref 26.0–34.0)
MCH: 29.1 pg (ref 26.0–34.0)
MCHC: 32.4 g/dL (ref 30.0–36.0)
MCHC: 32.6 g/dL (ref 30.0–36.0)
MCV: 88.8 fL (ref 78.0–100.0)
MCV: 89.3 fL (ref 78.0–100.0)
Platelets: 208 10*3/uL (ref 150–400)
Platelets: 214 10*3/uL (ref 150–400)
RBC: 3.93 MIL/uL (ref 3.87–5.11)
RBC: 4.02 MIL/uL (ref 3.87–5.11)
RDW: 14.4 % (ref 11.5–15.5)
RDW: 14.6 % (ref 11.5–15.5)
WBC: 4.6 10*3/uL (ref 4.0–10.5)
WBC: 8.2 10*3/uL (ref 4.0–10.5)

## 2017-08-14 MED ORDER — LACTATED RINGERS IV SOLN: 500.0000 mL | INTRAVENOUS | Status: DC | PRN

## 2017-08-14 MED ORDER — PENICILLIN G POT IN DEXTROSE 60000 UNIT/ML IV SOLN
3.0000 10*6.[IU] | INTRAVENOUS | Status: DC
  Administered 2017-08-14 – 2017-08-15 (×3): 3 10*6.[IU] via INTRAVENOUS
  Filled 2017-08-14 (×7): qty 50

## 2017-08-14 MED ORDER — FENTANYL 2.5 MCG/ML BUPIVACAINE 1/10 % EPIDURAL INFUSION (WH - ANES)
14.0000 mL/h | INTRAMUSCULAR | Status: DC | PRN
  Administered 2017-08-14 – 2017-08-15 (×2): 14 mL/h via EPIDURAL
  Filled 2017-08-14 (×2): qty 100

## 2017-08-14 MED ORDER — PENICILLIN G POTASSIUM 5000000 UNITS IJ SOLR
5.0000 10*6.[IU] | Freq: Once | INTRAVENOUS | Status: AC
  Administered 2017-08-14: 5 10*6.[IU] via INTRAVENOUS
  Filled 2017-08-14: qty 5

## 2017-08-14 MED ORDER — EPHEDRINE 5 MG/ML INJ
10.0000 mg | INTRAVENOUS | Status: DC | PRN
  Filled 2017-08-14: qty 2

## 2017-08-14 MED ORDER — ONDANSETRON HCL 4 MG/2ML IJ SOLN: 4.0000 mg | Freq: Four times a day (QID) | INTRAMUSCULAR | Status: DC | PRN

## 2017-08-14 MED ORDER — MAGNESIUM SULFATE 40 G IN LACTATED RINGERS - SIMPLE
2.0000 g/h | INTRAVENOUS | Status: AC
  Administered 2017-08-14 – 2017-08-15 (×2): 2 g/h via INTRAVENOUS
  Filled 2017-08-14 (×2): qty 40

## 2017-08-14 MED ORDER — OXYTOCIN 40 UNITS IN LACTATED RINGERS INFUSION - SIMPLE MED: 2.5000 [IU]/h | INTRAVENOUS | Status: DC

## 2017-08-14 MED ORDER — FENTANYL CITRATE (PF) 100 MCG/2ML IJ SOLN
100.0000 ug | INTRAMUSCULAR | Status: DC | PRN
  Administered 2017-08-14 (×2): 100 ug via INTRAVENOUS
  Filled 2017-08-14 (×2): qty 2

## 2017-08-14 MED ORDER — MAGNESIUM SULFATE BOLUS VIA INFUSION
4.0000 g | Freq: Once | INTRAVENOUS | Status: AC
  Administered 2017-08-14: 4 g via INTRAVENOUS
  Filled 2017-08-14: qty 500

## 2017-08-14 MED ORDER — LIDOCAINE HCL (PF) 1 % IJ SOLN
INTRAMUSCULAR | Status: DC | PRN
  Administered 2017-08-14 (×3): 4 mL via EPIDURAL

## 2017-08-14 MED ORDER — OXYTOCIN BOLUS FROM INFUSION
500.0000 mL | Freq: Once | INTRAVENOUS | Status: AC
  Administered 2017-08-15: 500 mL via INTRAVENOUS

## 2017-08-14 MED ORDER — LACTATED RINGERS IV SOLN: 500.0000 mL | Freq: Once | INTRAVENOUS | Status: DC

## 2017-08-14 MED ORDER — PHENYLEPHRINE 40 MCG/ML (10ML) SYRINGE FOR IV PUSH (FOR BLOOD PRESSURE SUPPORT)
80.0000 ug | PREFILLED_SYRINGE | INTRAVENOUS | Status: DC | PRN
  Filled 2017-08-14: qty 10
  Filled 2017-08-14: qty 5

## 2017-08-14 MED ORDER — ACETAMINOPHEN 325 MG PO TABS
650.0000 mg | ORAL_TABLET | ORAL | Status: DC | PRN
  Filled 2017-08-14: qty 2

## 2017-08-14 MED ORDER — OXYTOCIN 40 UNITS IN LACTATED RINGERS INFUSION - SIMPLE MED
1.0000 m[IU]/min | INTRAVENOUS | Status: DC
  Administered 2017-08-14: 1 m[IU]/min via INTRAVENOUS
  Filled 2017-08-14: qty 1000

## 2017-08-14 MED ORDER — TERBUTALINE SULFATE 1 MG/ML IJ SOLN: 0.2500 mg | Freq: Once | INTRAMUSCULAR | Status: DC | PRN

## 2017-08-14 MED ORDER — OXYCODONE-ACETAMINOPHEN 5-325 MG PO TABS: 1.0000 | ORAL_TABLET | ORAL | Status: DC | PRN

## 2017-08-14 MED ORDER — MISOPROSTOL 25 MCG QUARTER TABLET
25.0000 ug | ORAL_TABLET | ORAL | Status: DC | PRN
  Administered 2017-08-14: 25 ug via VAGINAL
  Filled 2017-08-14 (×3): qty 1

## 2017-08-14 MED ORDER — PHENYLEPHRINE 40 MCG/ML (10ML) SYRINGE FOR IV PUSH (FOR BLOOD PRESSURE SUPPORT)
80.0000 ug | PREFILLED_SYRINGE | INTRAVENOUS | Status: DC | PRN
  Filled 2017-08-14: qty 5

## 2017-08-14 MED ORDER — HYDRALAZINE HCL 20 MG/ML IJ SOLN: 10.0000 mg | Freq: Once | INTRAMUSCULAR | Status: DC | PRN

## 2017-08-14 MED ORDER — SOD CITRATE-CITRIC ACID 500-334 MG/5ML PO SOLN: 30.0000 mL | ORAL | Status: DC | PRN

## 2017-08-14 MED ORDER — LABETALOL HCL 5 MG/ML IV SOLN
20.0000 mg | INTRAVENOUS | Status: AC | PRN
  Administered 2017-08-14 – 2017-08-15 (×2): 40 mg via INTRAVENOUS
  Administered 2017-08-15: 20 mg via INTRAVENOUS
  Filled 2017-08-14 (×2): qty 4
  Filled 2017-08-14 (×2): qty 8

## 2017-08-14 MED ORDER — LIDOCAINE HCL (PF) 1 % IJ SOLN
30.0000 mL | INTRAMUSCULAR | Status: DC | PRN
  Filled 2017-08-14: qty 30

## 2017-08-14 MED ORDER — OXYCODONE-ACETAMINOPHEN 5-325 MG PO TABS: 2.0000 | ORAL_TABLET | ORAL | Status: DC | PRN

## 2017-08-14 MED ORDER — LACTATED RINGERS IV SOLN
INTRAVENOUS | Status: DC
  Administered 2017-08-14 – 2017-08-15 (×3): via INTRAVENOUS

## 2017-08-14 MED ORDER — DIPHENHYDRAMINE HCL 50 MG/ML IJ SOLN: 12.5000 mg | INTRAMUSCULAR | Status: DC | PRN

## 2017-08-14 MED ORDER — OXYTOCIN 40 UNITS IN LACTATED RINGERS INFUSION - SIMPLE MED: 1.0000 m[IU]/min | INTRAVENOUS | Status: DC

## 2017-08-14 NOTE — Progress Notes (Signed)
Laura Mcneil is a 31 y.o. G2P0010 at 6049w0d.  Subjective: Patient consents to FB placement after check showed 1.5dilation.  Her pain is well controlled and she is feeling her contractions without distress  Objective: BP 128/78   Pulse 95   Temp 98.2 F (36.8 C) (Oral)   Resp 16   Ht 5\' 11"  (1.803 m)   Wt 111.2 kg (245 lb 0.8 oz)   LMP 12/11/2016 (Approximate)   SpO2 100%   BMI 34.18 kg/m    FHT:  FHR: 145 bpm, variability: appropriate,  accelerations:  10x10,  decelerations:  none UC:   Q 2-843minutes, 40-60 Dilation: 1.5 Effacement (%): 60 Cervical Position: Middle Station: -1 Presentation: Vertex Exam by:: [name]  Labs: Results for orders placed or performed during the hospital encounter of 08/08/17 (from the past 24 hour(s))  CBC     Status: Abnormal   Collection Time: 08/14/17  8:05 AM  Result Value Ref Range   WBC 4.6 4.0 - 10.5 K/uL   RBC 3.93 3.87 - 5.11 MIL/uL   Hemoglobin 11.3 (L) 12.0 - 15.0 g/dL   HCT 19.134.9 (L) 47.836.0 - 29.546.0 %   MCV 88.8 78.0 - 100.0 fL   MCH 28.8 26.0 - 34.0 pg   MCHC 32.4 30.0 - 36.0 g/dL   RDW 62.114.4 30.811.5 - 65.715.5 %   Platelets 208 150 - 400 K/uL  Comprehensive metabolic panel     Status: Abnormal   Collection Time: 08/14/17  8:05 AM  Result Value Ref Range   Sodium 134 (L) 135 - 145 mmol/L   Potassium 4.3 3.5 - 5.1 mmol/L   Chloride 105 101 - 111 mmol/L   CO2 18 (L) 22 - 32 mmol/L   Glucose, Bld 72 65 - 99 mg/dL   BUN 11 6 - 20 mg/dL   Creatinine, Ser 8.460.62 0.44 - 1.00 mg/dL   Calcium 8.9 8.9 - 96.210.3 mg/dL   Total Protein 7.5 6.5 - 8.1 g/dL   Albumin 3.3 (L) 3.5 - 5.0 g/dL   AST 28 15 - 41 U/L   ALT 25 14 - 54 U/L   Alkaline Phosphatase 82 38 - 126 U/L   Total Bilirubin 1.1 0.3 - 1.2 mg/dL   GFR calc non Af Amer >60 >60 mL/min   GFR calc Af Amer >60 >60 mL/min   Anion gap 11 5 - 15    Assessment / Plan: 2549w0d week IUP Labor: pitocin/foley bulb Fetal Wellbeing:  Category 1 Pain Control:  Per maternal request Anticipated MOD:   svd  Marthenia RollingBland, Yuvin Bussiere, DO 08/14/2017 1:30 PM

## 2017-08-14 NOTE — Progress Notes (Signed)
Laura Mcneil is a 31 y.o. G2P0010 at 7063w0d.  She has been on 3rd floor for chronic HTN with superimposed Pre-eclampsia and is trasnferred today to L/D for IOL.  Subjective: She feels well with no headache/lightheadedness/pain/visual complaints.  Her main concern is eating because she is hungry and knows we will put her on a clear diet at some point soon.  Objective: BP (!) 161/100   Pulse 75   Temp 98.3 F (36.8 C) (Oral)   Resp 16   Ht 5\' 11"  (1.803 m)   Wt 111.2 kg (245 lb 0.8 oz)   LMP 12/11/2016 (Approximate)   SpO2 100%   BMI 34.18 kg/m    FHT:  FHR: 145 bpm, variability: appropriate,  accelerations:  10x10,  decelerations:  none UC:   Q 2-423minutes, 40-70sec Dilation: 1 Effacement (%): Thick Cervical Position: Posterior Presentation: Vertex(via bedside US) Exam by:: Marcos EkeJennifer Hazelwoood, RN  Labs: Results for orders placed or performed during the hospital encounter of 08/08/17 (from the past 24 hour(s))  CBC     Status: Abnormal   Collection Time: 08/14/17  8:05 AM  Result Value Ref Range   WBC 4.6 4.0 - 10.5 K/uL   RBC 3.93 3.87 - 5.11 MIL/uL   Hemoglobin 11.3 (L) 12.0 - 15.0 g/dL   HCT 40.934.9 (L) 81.136.0 - 91.446.0 %   MCV 88.8 78.0 - 100.0 fL   MCH 28.8 26.0 - 34.0 pg   MCHC 32.4 30.0 - 36.0 g/dL   RDW 78.214.4 95.611.5 - 21.315.5 %   Platelets 208 150 - 400 K/uL  Comprehensive metabolic panel     Status: Abnormal   Collection Time: 08/14/17  8:05 AM  Result Value Ref Range   Sodium 134 (L) 135 - 145 mmol/L   Potassium 4.3 3.5 - 5.1 mmol/L   Chloride 105 101 - 111 mmol/L   CO2 18 (L) 22 - 32 mmol/L   Glucose, Bld 72 65 - 99 mg/dL   BUN 11 6 - 20 mg/dL   Creatinine, Ser 0.860.62 0.44 - 1.00 mg/dL   Calcium 8.9 8.9 - 57.810.3 mg/dL   Total Protein 7.5 6.5 - 8.1 g/dL   Albumin 3.3 (L) 3.5 - 5.0 g/dL   AST 28 15 - 41 U/L   ALT 25 14 - 54 U/L   Alkaline Phosphatase 82 38 - 126 U/L   Total Bilirubin 1.1 0.3 - 1.2 mg/dL   GFR calc non Af Amer >60 >60 mL/min   GFR calc Af Amer >60 >60  mL/min   Anion gap 11 5 - 15    Assessment / Plan: on mag and labetolol protocol 5063w0d week IUP Labor: cytotec, will consider pit when further dilated Fetal Wellbeing:  Category 1 Pain Control:  Per maternal request Anticipated MOD:  NSVD  Marthenia RollingBland, Scott, DO 08/14/2017 9:48 AM   I confirm that I have verified the information documented in the resident's note and that I have also personally reperformed the physical exam and all medical decision making activities.   Thressa ShellerHeather Hogan 11:47 AM 08/14/17

## 2017-08-14 NOTE — Anesthesia Pain Management Evaluation Note (Signed)
  CRNA Pain Management Visit Note  Patient: Laura Mcneil, 31 y.o., female  "Hello [facility]. We have an anesthesia team available at all times to provide care throughout the hospital, including epidural management and anesthesia for C-section. I don't know your plan for the delivery whether it a natural birth, water birth, IV sedation, nitrous supplementation, doula or epidural, but we want to meet your pain goals."   1.Was your pain managed to your expectations on prior hospitalizations?   No prior hospitalizations  2.What is your expectation for pain management during this hospitalization?     Epidural and IV pain meds  3.How can we help you reach that goal? Be available  Record the patient's initial score and the patient's pain goal.   Pain: 2  Pain Goal: 7 [facility] wants you to be able to say your pain was always managed very well.  Edison PaceWILKERSON,Laura Mcneil 08/14/2017

## 2017-08-14 NOTE — Progress Notes (Signed)
Laura Mcneil is a 31 y.o. G2P0010 at 5425w0d her for IOL for SIPE.  Subjective: Pain controlled with epidural. Denies PIH symptoms. No concerns voiced.  Objective: BP 137/88   Pulse 89   Temp 97.7 F (36.5 C) (Oral)   Resp 16   Ht 5\' 11"  (1.803 m)   Wt 245 lb 0.8 oz (111.2 kg)   LMP 12/11/2016 (Approximate)   SpO2 95%   BMI 34.18 kg/m    FHT:  FHR: 135 bpm, variability: moderate,  accelerations: present,  decelerations:  none UC:   Q 2-133minutes Dilation: 5 Effacement (%): 80 Cervical Position: Middle Station: -1, -2 Presentation: Vertex Exam by:: CD[employer]H Beitz RN   Labs: Results for orders placed or performed during the hospital encounter of 08/08/17 (from the past 24 hour(s))  CBC     Status: Abnormal   Collection Time: 08/14/17  8:05 AM  Result Value Ref Range   WBC 4.6 4.0 - 10.5 K/uL   RBC 3.93 3.87 - 5.11 MIL/uL   Hemoglobin 11.3 (L) 12.0 - 15.0 g/dL   HCT 40.934.9 (L) 81.136.0 - 91.446.0 %   MCV 88.8 78.0 - 100.0 fL   MCH 28.8 26.0 - 34.0 pg   MCHC 32.4 30.0 - 36.0 g/dL   RDW 78.214.4 95.611.5 - 21.315.5 %   Platelets 208 150 - 400 K/uL  Comprehensive metabolic panel     Status: Abnormal   Collection Time: 08/14/17  8:05 AM  Result Value Ref Range   Sodium 134 (L) 135 - 145 mmol/L   Potassium 4.3 3.5 - 5.1 mmol/L   Chloride 105 101 - 111 mmol/L   CO2 18 (L) 22 - 32 mmol/L   Glucose, Bld 72 65 - 99 mg/dL   BUN 11 6 - 20 mg/dL   Creatinine, Ser 0.860.62 0.44 - 1.00 mg/dL   Calcium 8.9 8.9 - 57.810.3 mg/dL   Total Protein 7.5 6.5 - 8.1 g/dL   Albumin 3.3 (L) 3.5 - 5.0 g/dL   AST 28 15 - 41 U/L   ALT 25 14 - 54 U/L   Alkaline Phosphatase 82 38 - 126 U/L   Total Bilirubin 1.1 0.3 - 1.2 mg/dL   GFR calc non Af Amer >60 >60 mL/min   GFR calc Af Amer >60 >60 mL/min   Anion gap 11 5 - 15  CBC     Status: Abnormal   Collection Time: 08/14/17  6:17 PM  Result Value Ref Range   WBC 8.2 4.0 - 10.5 K/uL   RBC 4.02 3.87 - 5.11 MIL/uL   Hemoglobin 11.7 (L) 12.0 - 15.0 g/dL   HCT 46.935.9 (L) 62.936.0 -  46.0 %   MCV 89.3 78.0 - 100.0 fL   MCH 29.1 26.0 - 34.0 pg   MCHC 32.6 30.0 - 36.0 g/dL   RDW 52.814.6 41.311.5 - 24.415.5 %   Platelets 214 150 - 400 K/uL    Assessment / Plan: 7925w0d week IUP IOL for SIPE, progressing normally  Labor: FB now out. Will start pitocin. Early labor Preeclampsia: BPs stable on PO labetalol and Mag, asymptomatic, PEC labs normal Fetal Wellbeing:  Category 1 Pain Control:  Epidural Anticipated MOD:  svd  Pincus Largehelps, Thekla Colborn Y, DO 08/14/2017 9:08 PM

## 2017-08-14 NOTE — Anesthesia Preprocedure Evaluation (Signed)
Anesthesia Evaluation  Patient identified by MRN, date of birth, ID band Patient awake    Reviewed: Allergy & Precautions, H&P , NPO status , Patient's Chart, lab work & pertinent test results  History of Anesthesia Complications Negative for: history of anesthetic complications  Airway Mallampati: II  TM Distance: >3 FB Neck ROM: full    Dental no notable dental hx. (+) Teeth Intact   Pulmonary neg pulmonary ROS,    Pulmonary exam normal breath sounds clear to auscultation       Cardiovascular hypertension, Pt. on medications Normal cardiovascular exam Rhythm:regular Rate:Normal     Neuro/Psych negative neurological ROS  negative psych ROS   GI/Hepatic negative GI ROS, Neg liver ROS,   Endo/Other  negative endocrine ROS  Renal/GU negative Renal ROS  negative genitourinary   Musculoskeletal   Abdominal (+) + obese,   Peds  Hematology  (+) anemia ,   Anesthesia Other Findings Pre-eclampsia  Reproductive/Obstetrics (+) Pregnancy                             Anesthesia Physical Anesthesia Plan  ASA: II  Anesthesia Plan: Epidural   Post-op Pain Management:    Induction:   PONV Risk Score and Plan:   Airway Management Planned:   Additional Equipment:   Intra-op Plan:   Post-operative Plan:   Informed Consent: I have reviewed the patients History and Physical, chart, labs and discussed the procedure including the risks, benefits and alternatives for the proposed anesthesia with the patient or authorized representative who has indicated his/her understanding and acceptance.     Plan Discussed with:   Anesthesia Plan Comments:         Anesthesia Quick Evaluation

## 2017-08-14 NOTE — Anesthesia Procedure Notes (Signed)
Epidural Patient location during procedure: OB Start time: 08/14/2017 7:05 PM  Staffing Anesthesiologist: Leonides GrillsEllender, Miklo Aken P, MD Performed: anesthesiologist   Preanesthetic Checklist Completed: patient identified, site marked, pre-op evaluation, timeout performed, IV checked, risks and benefits discussed and monitors and equipment checked  Epidural Patient position: sitting Prep: DuraPrep Patient monitoring: heart rate, cardiac monitor, continuous pulse ox and blood pressure Approach: midline Location: L2-L3 Injection technique: LOR air  Needle:  Needle type: Tuohy  Needle gauge: 17 G Needle length: 9 cm Needle insertion depth: 7 cm Catheter type: closed end flexible Catheter size: 19 Gauge Catheter at skin depth: 12 cm Test dose: negative and Other  Assessment Events: blood not aspirated, injection not painful, no injection resistance and negative IV test  Additional Notes Informed consent obtained prior to proceeding including risk of failure, 1% risk of PDPH, risk of minor discomfort and bruising. Discussed alternatives to epidural analgesia and patient desires to proceed.  Timeout performed pre-procedure verifying patient name, procedure, and platelet count.  Patient tolerated procedure well. Reason for block:procedure for pain

## 2017-08-15 ENCOUNTER — Encounter: Payer: Medicaid Other | Admitting: Obstetrics and Gynecology

## 2017-08-15 ENCOUNTER — Encounter (HOSPITAL_COMMUNITY): Payer: Self-pay

## 2017-08-15 DIAGNOSIS — O114 Pre-existing hypertension with pre-eclampsia, complicating childbirth: Secondary | ICD-10-CM

## 2017-08-15 DIAGNOSIS — O113 Pre-existing hypertension with pre-eclampsia, third trimester: Secondary | ICD-10-CM

## 2017-08-15 DIAGNOSIS — Z3A33 33 weeks gestation of pregnancy: Secondary | ICD-10-CM

## 2017-08-15 DIAGNOSIS — Z3A34 34 weeks gestation of pregnancy: Secondary | ICD-10-CM

## 2017-08-15 LAB — CBC
HCT: 35.3 % — ABNORMAL LOW (ref 36.0–46.0)
HEMOGLOBIN: 11.6 g/dL — AB (ref 12.0–15.0)
MCH: 29.1 pg (ref 26.0–34.0)
MCHC: 32.9 g/dL (ref 30.0–36.0)
MCV: 88.7 fL (ref 78.0–100.0)
PLATELETS: 201 10*3/uL (ref 150–400)
RBC: 3.98 MIL/uL (ref 3.87–5.11)
RDW: 14.4 % (ref 11.5–15.5)
WBC: 8.8 10*3/uL (ref 4.0–10.5)

## 2017-08-15 MED ORDER — SENNOSIDES-DOCUSATE SODIUM 8.6-50 MG PO TABS
2.0000 | ORAL_TABLET | ORAL | Status: DC
  Administered 2017-08-15: 2 via ORAL
  Filled 2017-08-15 (×2): qty 2

## 2017-08-15 MED ORDER — TETANUS-DIPHTH-ACELL PERTUSSIS 5-2.5-18.5 LF-MCG/0.5 IM SUSP: 0.5000 mL | Freq: Once | INTRAMUSCULAR | Status: DC

## 2017-08-15 MED ORDER — WITCH HAZEL-GLYCERIN EX PADS: 1.0000 "application " | MEDICATED_PAD | CUTANEOUS | Status: DC | PRN

## 2017-08-15 MED ORDER — ONDANSETRON HCL 4 MG PO TABS: 4.0000 mg | ORAL_TABLET | ORAL | Status: DC | PRN

## 2017-08-15 MED ORDER — COCONUT OIL OIL
1.0000 "application " | TOPICAL_OIL | Status: DC | PRN
  Administered 2017-08-15: 1 via TOPICAL
  Filled 2017-08-15: qty 120

## 2017-08-15 MED ORDER — ACETAMINOPHEN 325 MG PO TABS
650.0000 mg | ORAL_TABLET | ORAL | Status: DC | PRN
  Administered 2017-08-17: 650 mg via ORAL
  Filled 2017-08-15: qty 2

## 2017-08-15 MED ORDER — DIPHENHYDRAMINE HCL 25 MG PO CAPS: 25.0000 mg | ORAL_CAPSULE | Freq: Four times a day (QID) | ORAL | Status: DC | PRN

## 2017-08-15 MED ORDER — ONDANSETRON HCL 4 MG/2ML IJ SOLN: 4.0000 mg | INTRAMUSCULAR | Status: DC | PRN

## 2017-08-15 MED ORDER — PRENATAL MULTIVITAMIN CH
1.0000 | ORAL_TABLET | Freq: Every day | ORAL | Status: DC
  Administered 2017-08-15 – 2017-08-19 (×5): 1 via ORAL
  Filled 2017-08-15 (×5): qty 1

## 2017-08-15 MED ORDER — LABETALOL HCL 200 MG PO TABS
200.0000 mg | ORAL_TABLET | Freq: Two times a day (BID) | ORAL | Status: DC
  Administered 2017-08-15 – 2017-08-16 (×3): 200 mg via ORAL
  Filled 2017-08-15: qty 1
  Filled 2017-08-15: qty 2
  Filled 2017-08-15: qty 1

## 2017-08-15 MED ORDER — ZOLPIDEM TARTRATE 5 MG PO TABS
5.0000 mg | ORAL_TABLET | Freq: Every evening | ORAL | Status: DC | PRN
  Administered 2017-08-17: 5 mg via ORAL
  Filled 2017-08-15: qty 1

## 2017-08-15 MED ORDER — IBUPROFEN 600 MG PO TABS
600.0000 mg | ORAL_TABLET | Freq: Four times a day (QID) | ORAL | Status: DC
  Administered 2017-08-15 – 2017-08-16 (×7): 600 mg via ORAL
  Filled 2017-08-15 (×7): qty 1

## 2017-08-15 MED ORDER — SIMETHICONE 80 MG PO CHEW: 80.0000 mg | CHEWABLE_TABLET | ORAL | Status: DC | PRN

## 2017-08-15 MED ORDER — DIBUCAINE 1 % RE OINT: 1.0000 "application " | TOPICAL_OINTMENT | RECTAL | Status: DC | PRN

## 2017-08-15 MED ORDER — BENZOCAINE-MENTHOL 20-0.5 % EX AERO
1.0000 "application " | INHALATION_SPRAY | CUTANEOUS | Status: DC | PRN
  Administered 2017-08-15: 1 via TOPICAL
  Filled 2017-08-15: qty 56

## 2017-08-15 MED ORDER — LACTATED RINGERS IV SOLN
INTRAVENOUS | Status: DC
  Administered 2017-08-15 (×2): via INTRAVENOUS

## 2017-08-15 NOTE — Anesthesia Postprocedure Evaluation (Signed)
Anesthesia Post Note  Patient: Laura Mcneil  Procedure(s) Performed: AN AD HOC LABOR EPIDURAL     Patient location during evaluation: [facility] Anesthesia Type: Epidural Level of consciousness: awake and alert and oriented Pain management: pain level controlled Vital Signs Assessment: post-procedure vital signs reviewed and stable Respiratory status: spontaneous breathing and nonlabored ventilation Cardiovascular status: stable Postop Assessment: no headache, no backache, epidural receding, patient able to bend at knees and no apparent nausea or vomiting Anesthetic complications: no    Last Vitals:  Vitals:   08/15/17 0551 08/15/17 0658  BP: (!) 155/99 136/86  Pulse: 83 84  Resp: 16 18  Temp: 36.8 C 36.8 C  SpO2: 99% 100%    Last Pain:  Vitals:   08/15/17 0658  TempSrc: Oral  PainSc:    Pain Goal: Patients Stated Pain Goal: 2 (08/15/17 0654)               Laban EmperorMalinova,Aikeem Lilley Hristova

## 2017-08-15 NOTE — Progress Notes (Signed)
Labor Progress Note Laura Mcneil is a 31 y.o. G2P0010 at 5144w1d presented for IOL for SIPE. S: No complaints. Epidural is controlling pain well. Patient is comfotable.  O:  BP 124/79   Pulse 86   Temp 98.1 F (36.7 C) (Oral)   Resp 16   Ht 5\' 11"  (1.803 m)   Wt 111.2 kg (245 lb 0.8 oz)   LMP 12/11/2016 (Approximate)   SpO2 95%   BMI 34.18 kg/m    CVE: Dilation: 5.5 Effacement (%): 90 Cervical Position: Middle Station: -2 Presentation: Vertex Exam by:: (Dr. Doroteo GlassmanPhelps)    FHT: FHR: 130 pbm, Variability: moderate Accelerations: present Decelerations: absent UC: Q 3-4.5 mins  A&P: 31 y.o. G2P0010 3744w1d here for IOL for SPIE #Labor: Progressing well. Continue pitocin #Pain: epidural #FWB: Category 1 #GBS positive  #Pre-eclampsia: Bps stable on PO labetalol and Mag, asymptomatic, PEC labs normal   Laura Mcneil, Student-PA 12:33 AM

## 2017-08-15 NOTE — Consult Note (Signed)
Neonatology Note:   Attendance at Delivery:    I was asked by Dr. Phelps to attend this NSVD at 34 1/7 weeks after IOL for pre-eclampsia. The mother is a G2P0A1 B pos, GBS positive with chronic HTN (on Labetalol and aspirin) and superimposed pre-eclampsia, being induced starting at 34 weeks. She was admitted 1 week ago and has been treated with Magnesium sulfate and Procardia, and got 2 doses of Betamethasone on 12/6-7. She also got Pen G > 4 hours before delivery and was afebrile during labor. ROM 3 hours prior to delivery, fluid clear. Our team was called after delivery of infant and we arrived at 3 minutes of age. By report, infant was vigorous with good spontaneous cry and tone, so delayed cord clamping was done. On our arrival, she was crying, with good tone and color. Needed only minimal bulb suctioning. Ap 8/9. Lungs clear to ausc in DR. O2 saturations within normal parameters throughout, in room air. Held by mother briefly, and I spoke with the parents in the DR, then transported the baby to NICU for further care.   Henri Guedes C. Renardo Cheatum, MD   

## 2017-08-16 LAB — BIRTH TISSUE RECOVERY COLLECTION (PLACENTA DONATION)

## 2017-08-16 MED ORDER — IBUPROFEN 600 MG PO TABS: 600.0000 mg | ORAL_TABLET | Freq: Four times a day (QID) | ORAL | Status: DC | PRN

## 2017-08-16 MED ORDER — LABETALOL HCL 200 MG PO TABS
300.0000 mg | ORAL_TABLET | Freq: Two times a day (BID) | ORAL | Status: DC
Start: 2017-08-16 — End: 2017-08-17
  Administered 2017-08-16 – 2017-08-17 (×2): 300 mg via ORAL
  Filled 2017-08-16 (×2): qty 1

## 2017-08-16 NOTE — Progress Notes (Signed)
Post Partum Day 1 Subjective: no complaints, up ad lib, voiding and tolerating PO  Objective: Blood pressure 134/78, pulse 76, temperature 98.4 F (36.9 C), temperature source Oral, resp. rate 18, height 5\' 11"  (1.803 m), weight 245 lb 0.8 oz (111.2 kg), last menstrual period 12/11/2016, SpO2 99 %, unknown if currently breastfeeding.  Physical Exam:  General: alert, cooperative and no distress Lochia: appropriate Uterine Fundus: firm DVT Evaluation: No evidence of DVT seen on physical exam. Negative Homan's sign. No cords or calf tenderness. No significant calf/ankle edema.  Recent Labs    08/14/17 1817 08/15/17 0524  HGB 11.7* 11.6*  HCT 35.9* 35.3*    Assessment/Plan: Plan for discharge tomorrow and Breastfeeding   LOS: 8 days   Laura Mcneil 08/16/2017, 11:30 AM

## 2017-08-16 NOTE — Lactation Note (Signed)
This note was copied from a baby's chart. Lactation Consultation Note  Patient Name: Laura Mcneil ZOXWR'UToday's Date: 08/16/2017 Reason for consult: Initial assessment;Late-preterm 34-36.6wks;NICU baby;1st time breastfeeding Infant is 8336 hours old & mom was seen by Lactation for Initial assessment. Baby was born at 663w1d & weighed 4 lbs 2.1 oz at birth and is in the NICU. Mom reports she has been pumping and removed ~4110mL yesterday once but has only seen drops since then. Explained that this can be normal and that pumping is important for stimulation. Mom reported she understood. Reviewed NICU booklet - milk storage guidelines, pumping guidelines, hand expression, skin-to-skin & latching when able. Mom reports that her breasts are a little tender after pumping and has been using coconut oil. When asked about how the pump felt, mom reports the breast shields feel a little small and wondered if there were bigger ones. Mom had been using the 27mm flanges so brought mom some 30mm flanges for her to try. Encouraged mom to pump at least 8x in 24hrs (or 8-12x in 24hrs) for 15-20 mins followed by hand expression. WIC loaned her a DEBP today to go home with. Mom reports no questions. Encouraged mom to ask for help as needed.  Maternal Data Has patient been taught Hand Expression?: Yes  Feeding Feeding Type: Formula Length of feed: 30 min  LATCH Score                   Interventions    Lactation Tools Discussed/Used WIC Program: Yes   Consult Status Consult Status: Follow-up Date: 08/17/17 Follow-up type: In-patient    Oneal GroutLaura C Juanita Mcneil 08/16/2017, 4:43 PM

## 2017-08-16 NOTE — Progress Notes (Signed)
Patient screened out for psychosocial assessment since none of the following apply:  Psychosocial stressors documented in mother or baby's chart  Gestation less than 32 weeks  Code at delivery   Infant with anomalies Please contact the Clinical Social Worker if specific needs arise, or by MOB's request.   Daquarius Dubeau Boyd-Gilyard, MSW, LCSW Clinical Social Work [phone]  

## 2017-08-17 MED ORDER — HYDRALAZINE HCL 20 MG/ML IJ SOLN
5.0000 mg | INTRAMUSCULAR | Status: AC | PRN
  Administered 2017-08-17: 10 mg via INTRAVENOUS
  Administered 2017-08-17: 5 mg via INTRAVENOUS
  Filled 2017-08-17 (×2): qty 1

## 2017-08-17 MED ORDER — LABETALOL HCL 5 MG/ML IV SOLN
20.0000 mg | INTRAVENOUS | Status: DC | PRN
Start: 2017-08-17 — End: 2017-08-19
  Administered 2017-08-17: 40 mg via INTRAVENOUS
  Administered 2017-08-17: 20 mg via INTRAVENOUS
  Filled 2017-08-17: qty 8

## 2017-08-17 MED ORDER — MEASLES, MUMPS & RUBELLA VAC ~~LOC~~ INJ
0.5000 mL | INJECTION | Freq: Once | SUBCUTANEOUS | Status: AC
  Administered 2017-08-19: 0.5 mL via SUBCUTANEOUS
  Filled 2017-08-17 (×2): qty 0.5

## 2017-08-17 MED ORDER — LABETALOL HCL 5 MG/ML IV SOLN
INTRAVENOUS | Status: AC
  Administered 2017-08-17: 20 mg via INTRAVENOUS
  Filled 2017-08-17: qty 4

## 2017-08-17 MED ORDER — AMLODIPINE BESYLATE 5 MG PO TABS
5.0000 mg | ORAL_TABLET | Freq: Every day | ORAL | Status: DC
  Administered 2017-08-17 – 2017-08-18 (×2): 5 mg via ORAL
  Filled 2017-08-17 (×2): qty 1

## 2017-08-17 NOTE — Progress Notes (Signed)
Daily Antepartum Note  Admission Date: 08/08/2017 Current Date: 08/17/2017 7:29 AM  Laura Mcneil is a 31 y.o. G2P0111, HD#10/PPD#2 s/p SVD/intact perineum @ 34wks, admitted for severe pre-eclampsia superimposed on cHTN.  Pregnancy complicated by: Patient Active Problem List   Diagnosis Date Noted  . SVD (spontaneous vaginal delivery) 08/15/2017  . NST (non-stress test) with decelerations   . GBS (group B Streptococcus carrier), +RV culture, currently pregnant 08/11/2017  . Pre-eclampsia 08/09/2017  . Chronic hypertension with superimposed pre-eclampsia 08/08/2017  . Obesity in pregnancy 06/10/2017  . Chronic hypertension affecting pregnancy 04/03/2017  . Supervision of high-risk pregnancy 04/03/2017    Overnight/24hr events:  Labetalol increased to 300 bid from 200; first dose at 2245 last night.   Subjective:  No s/s of pre-clampsia, meeting all PP goals  Objective:   Vitals:   08/16/17 2347 08/17/17 0414  BP: (!) 151/84 (!) 154/88  Pulse: 84 82  Resp: 18 16  Temp:  98.7 F (37.1 C)  SpO2: 98% 100%   Temp:  [98.3 F (36.8 C)-98.7 F (37.1 C)] 98.7 F (37.1 C) (12/15 0414) Pulse Rate:  [76-84] 82 (12/15 0414) Resp:  [16-19] 16 (12/15 0414) BP: (134-160)/(78-88) 154/88 (12/15 0414) SpO2:  [98 %-100 %] 100 % (12/15 0414) Weight:  [246 lb 12 oz (111.9 kg)] 246 lb 12 oz (111.9 kg) (12/15 0414) Temp (24hrs), Avg:98.5 F (36.9 C), Min:98.3 F (36.8 C), Max:98.7 F (37.1 C)  No intake or output data in the 24 hours ending 08/17/17 0729   Current Vital Signs 24h Vital Sign Ranges  T 98.7 F (37.1 C) Temp  Avg: 98.5 F (36.9 C)  Min: 98.3 F (36.8 C)  Max: 98.7 F (37.1 C)  BP (!) 154/88 BP  Min: 134/78  Max: 160/81  HR 82 Pulse  Avg: 81  Min: 76  Max: 84  RR 16 Resp  Avg: 17.8  Min: 16  Max: 19  SaO2 100 % Not Delivered SpO2  Avg: 99.3 %  Min: 98 %  Max: 100 %       24 Hour I/O Current Shift I/O  Time Ins Outs No intake/output data recorded. No intake/output  data recorded.   Patient Vitals for the past 24 hrs:  BP Temp Temp src Pulse Resp SpO2 Weight  08/17/17 0414 (!) 154/88 98.7 F (37.1 C) Oral 82 16 100 % 246 lb 12 oz (111.9 kg)  08/16/17 2347 (!) 151/84 - - 84 18 98 % -  08/16/17 2030 (!) 149/80 - - - - - -  08/16/17 2015 (!) 160/81 98.3 F (36.8 C) Oral 80 19 99 % -  08/16/17 1728 (!) 152/88 - - 80 - - -  08/16/17 1652 (!) 157/87 98.3 F (36.8 C) Oral 82 18 100 % -  08/16/17 1230 138/85 98.6 F (37 C) Oral 83 18 100 % -  08/16/17 0801 134/78 98.4 F (36.9 C) Oral 76 18 99 % -    Physical exam: General: Well nourished, well developed female in no acute distress. Abdomen: nttp, FF below the umbilicus Cardiovascular: S1, S2 normal, no murmur, rub or gallop, regular rate and rhythm Respiratory: CTAB Extremities: no clubbing, cyanosis or edema Skin: Warm and dry.   Medications: Current Facility-Administered Medications  Medication Dose Route Frequency Provider Last Rate Last Dose  . acetaminophen (TYLENOL) tablet 650 mg  650 mg Oral Q4H PRN Luiz Blare Y, DO      . benzocaine-Menthol (DERMOPLAST) 20-0.5 % topical spray 1 application  1 application  Topical PRN Katheren Shams, DO   1 application at 42/59/56 3875  . coconut oil  1 application Topical PRN Katheren Shams, DO   1 application at 64/33/29 5188  . witch hazel-glycerin (TUCKS) pad 1 application  1 application Topical PRN Katheren Shams, DO       And  . dibucaine (NUPERCAINAL) 1 % rectal ointment 1 application  1 application Rectal PRN Katheren Shams, DO      . diphenhydrAMINE (BENADRYL) capsule 25 mg  25 mg Oral Q6H PRN Luiz Blare Y, DO      . ibuprofen (ADVIL,MOTRIN) tablet 600 mg  600 mg Oral Q6H PRN Aletha Halim, MD      . labetalol (NORMODYNE) tablet 300 mg  300 mg Oral BID Aletha Halim, MD   300 mg at 08/16/17 2246  . ondansetron (ZOFRAN) tablet 4 mg  4 mg Oral Q4H PRN Katheren Shams, DO       Or  . ondansetron [facility]) injection 4 mg  4 mg  Intravenous Q4H PRN Luiz Blare Y, DO      . prenatal multivitamin tablet 1 tablet  1 tablet Oral Q1200 Katheren Shams, DO   1 tablet at 08/16/17 1245  . senna-docusate (Senokot-S) tablet 2 tablet  2 tablet Oral Q24H Katheren Shams, DO   2 tablet at 08/15/17 2358  . simethicone (MYLICON) chewable tablet 80 mg  80 mg Oral PRN Katheren Shams, DO      . Tdap (BOOSTRIX) injection 0.5 mL  0.5 mL Intramuscular Once Gerarda Fraction, Jazma Y, DO      . zolpidem (AMBIEN) tablet 5 mg  5 mg Oral QHS PRN Katheren Shams, DO        Labs:  Recent Labs  Lab 08/14/17 0805 08/14/17 1817 08/15/17 0524  WBC 4.6 8.2 8.8  HGB 11.3* 11.7* 11.6*  HCT 34.9* 35.9* 35.3*  PLT 208 214 201    Recent Labs  Lab 08/11/17 1102 08/14/17 0805  NA 136 134*  K 3.6 4.3  CL 108 105  CO2 19* 18*  BUN 10 11  CREATININE 0.62 0.62  CALCIUM 8.7* 8.9  PROT 6.6 7.5  BILITOT 0.3 1.1  ALKPHOS 71 82  ALT 21 25  AST 19 28  GLUCOSE 87 72   B POS  Radiology: none  Assessment & Plan:  Pt stable *PP: meeting all goals. Breast. Undecided about BC. MMR ordered *cHTN with superimposed severe pre-x: follow up BPs. D/w pt that may be later d/c today but may keep until tomorrow. 1wk BP request already sent to clinic *PPx: OOB ad lib *Dispo: possibly later today.   Durene Romans MD Attending Center for [facility])

## 2017-08-17 NOTE — Lactation Note (Signed)
This note was copied from a baby's chart. Lactation Consultation Note  Patient Name: Laura Mcneil RUEAV'WToday's Date: 08/17/2017 Reason for consult: Follow-up assessment Baby at 56 hr of life. Upon entry mom was finishing a pumping. She was able to express over 20ml twice today. [facility]ed changing the pump settings [facility]. Mom thinks she will go home tonight. She has the phone number to call lactation with questions. Encouraged mom to make an apt with lactation when baby is ready to latch.   Maternal Data    Feeding Feeding Type: Breast Milk Length of feed: 30 min  LATCH Score                   Interventions    Lactation Tools Discussed/Used     Consult Status Consult Status: Follow-up Date: 08/18/17 Follow-up type: In-patient    Laura Mcneil 08/17/2017, 12:25 PM

## 2017-08-18 MED ORDER — AMLODIPINE BESYLATE 10 MG PO TABS
10.0000 mg | ORAL_TABLET | Freq: Every day | ORAL | Status: DC
  Administered 2017-08-19: 10 mg via ORAL
  Filled 2017-08-18: qty 1

## 2017-08-18 MED ORDER — AMLODIPINE BESYLATE 5 MG PO TABS
5.0000 mg | ORAL_TABLET | Freq: Once | ORAL | Status: AC
  Administered 2017-08-18: 5 mg via ORAL
  Filled 2017-08-18: qty 1

## 2017-08-18 NOTE — Progress Notes (Signed)
Post Partum Day 3 Subjective: Patient reports feeling great this morning. She is eager to be discharged today. She denies any headaches, visual changes, RUQ/epigastric pain. She attributes all her symptoms to a lack of sleep. She states that she had a good night sleep and woke up like a "new woman".  Objective: Blood pressure (!) 145/88, pulse 92, temperature 98.8 F (37.1 C), temperature source Oral, resp. rate 17, height 5\' 11"  (1.803 m), weight 239 lb 12 oz (108.7 kg), last menstrual period 12/11/2016, SpO2 95 %, unknown if currently breastfeeding.  Physical Exam:  General: alert, cooperative and no distress Lochia: appropriate Uterine Fundus: firm DVT Evaluation: No evidence of DVT seen on physical exam.  No results for input(s): HGB, HCT in the last 72 hours.  Assessment/Plan: HTN stable this morning Continue Norvasc Will monitor closely today with plan for possible discharge this afternoon pending BP   LOS: 10 days   Kash Davie 08/18/2017, 6:54 AM

## 2017-08-18 NOTE — Plan of Care (Signed)
POC discussed with pt

## 2017-08-18 NOTE — Lactation Note (Signed)
This note was copied from a baby's chart. Lactation Consultation Note  Patient Name: Girl Donavan Foilnitra Odonell ZOXWR'UToday's Date: 08/18/2017 Reason for consult: Follow-up assessment;Other (Comment)(B/P elevated , and med changed this am per mom - Norvase ) Baby is 3479 hours old .  LC visited mom in room 64312  Mom getting started to hand express and pump both breast.  Per mom had been hand expressing one breast at a time and then pumping one breast at a time.  LC recommended hand expressing both breast and then pumping both breast together to save time for 15 -20 mins.  Milk is in and per mom the most pump off is 70 ml.  LC praised mom for her consistence.  LC reviewed supply and demand and the importance of pumping at least 8 tines a day and it may be 10 x's.  Storage to breast milk reviewed.  Mother informed of post-discharge support and given phone number to the lactation department, including services for phone call assistance; out-patient appointments; and breastfeeding support group. List of other breastfeeding resources in the community given in the handout. Encouraged mother to call for problems or concerns related to breastfeeding.    Maternal Data Has patient been taught Hand Expression?: Yes  Feeding Feeding Type: Breast Milk with Formula added Length of feed: 30 min  LATCH Score                   Interventions Interventions: Breast feeding basics reviewed;DEBP  Lactation Tools Discussed/Used Tools: Pump Breast pump type: Double-Electric Breast Pump WIC Program: Yes Pump Review: Milk Storage;Setup, frequency, and cleaning(LC reviewed ) Initiated by:: MAI  Date initiated:: 08/18/17   Consult Status Consult Status: PRN Follow-up type: In-patient(baby in NICU )    Matilde SprangMargaret Ann Morrill Bomkamp 08/18/2017, 11:21 AM

## 2017-08-19 MED ORDER — AMLODIPINE BESYLATE 10 MG PO TABS: 10.0000 mg | ORAL_TABLET | Freq: Every day | ORAL | 1 refills | Status: DC

## 2017-08-19 MED ORDER — IBUPROFEN 600 MG PO TABS: 600.0000 mg | ORAL_TABLET | Freq: Four times a day (QID) | ORAL | 0 refills | Status: DC | PRN

## 2017-08-19 NOTE — Progress Notes (Signed)
Pt discharged to home.  Discharge instructions reviewed by Daphane Shepherd. Galloway, RN. Pt ambulated to NICU with plans to leave hospital from there.

## 2017-08-19 NOTE — Discharge Instructions (Signed)

## 2017-08-19 NOTE — Discharge Summary (Signed)
Obstetric Discharge Summary Reason for Admission: [facility] Prenatal Procedures: NST Intrapartum Procedures: spontaneous vaginal delivery Postpartum Procedures: none Complications-Operative and Postpartum: none Hemoglobin  Date Value Ref Range Status  08/15/2017 11.6 (Mcneil) 12.0 - 15.0 g/dL Final  16/10/960411/01/2017 54.010.5 (Mcneil) 11.1 - 15.9 g/dL Final   HCT  Date Value Ref Range Status  08/15/2017 35.3 (Mcneil) 36.0 - 46.0 % Final   Hematocrit  Date Value Ref Range Status  07/08/2017 31.3 (Mcneil) 34.0 - 46.6 % Final   Hospital Course:  Pt [facility] at 2033 1/2 weeks [facility] Received BMZ. IOL at 34 weeks without problems. See delivery note for additional information. Postpartum course was complicated by BP control. Started on Norvasc and increased to 10 mg po. BP at time of discharge 140's/80-90's. Pt otherwise progressed to ambulating without problems, tolerating diet, voiding, + flatus and good oral pain control. Felt amendable for discharge home on PPD # 3.  Physical Exam:  General: alert Lochia: appropriate Uterine Fundus: firm Incision: healing well DVT Evaluation: No evidence of DVT seen on physical exam.  Discharge Diagnoses: [facility] weeks [facility]  Discharge Information: Date: 08/19/2017 Activity: pelvic rest Diet: routine Medications: PNV, Ibuprofen and Norvasc Condition: stable Instructions: refer to practice specific booklet Discharge to: home Follow-up Information    [facility] Schedule an appointment as soon as possible for a visit in 1 week(s).   Specialty:  Obstetrics and Gynecology Why:  1 week for BP check 4 weeks for PP visit Contact information: [address] [place] [id number] [phone]          Newborn Data: Live born female  Birth Weight: 4 lb 2.1 oz (1875 g) APGAR: 8, 9  Newborn Delivery   Birth date/time:  08/15/2017 03:32:00 Delivery type:  Vaginal, Spontaneous     Stable in NICU.  Laura Mcneil  Laura Mcneil 08/19/2017, 11:26 AM

## 2017-08-23 ENCOUNTER — Ambulatory Visit: Payer: Medicaid Other | Admitting: General Practice

## 2017-08-23 VITALS — BP 136/90 | HR 86 | Ht 71.0 in | Wt 236.0 lb

## 2017-08-23 DIAGNOSIS — Z0130 Encounter for examination of blood pressure without abnormal findings: Secondary | ICD-10-CM

## 2017-08-23 NOTE — Progress Notes (Signed)
Patient here for BP check today. Patient denies headaches, dizziness or blurry vision. Patient is taking norvasc daily.  Reviewed with Venia CarbonJennifer Rasch who is fine with patient's blood pressure today, patient should continue norvasc daily & follow up for routine postpartum visit.  Discussed with patient and advised her to contact us or go to [facility] if she develops headaches, dizziness, or blurry vision. Patient verbalized understanding & had no questions

## 2017-08-23 NOTE — Progress Notes (Signed)
I agree with the RN Lyla Sonarrie Hillman's note and plan of care Patient to follow up at her PP visit and continue BP medication   Imran Nuon, Harolyn RutherfordJennifer I, NP 08/23/2017 9:34 AM

## 2017-09-25 ENCOUNTER — Ambulatory Visit: Payer: Medicaid Other | Admitting: Advanced Practice Midwife

## 2017-10-01 ENCOUNTER — Ambulatory Visit: Payer: Medicaid Other | Admitting: Advanced Practice Midwife

## 2017-10-14 ENCOUNTER — Ambulatory Visit (INDEPENDENT_AMBULATORY_CARE_PROVIDER_SITE_OTHER): Payer: Medicaid Other | Admitting: Nurse Practitioner

## 2017-10-14 ENCOUNTER — Encounter: Payer: Self-pay | Admitting: Nurse Practitioner

## 2017-10-14 DIAGNOSIS — Z30017 Encounter for initial prescription of implantable subdermal contraceptive: Secondary | ICD-10-CM

## 2017-10-14 DIAGNOSIS — E669 Obesity, unspecified: Secondary | ICD-10-CM

## 2017-10-14 DIAGNOSIS — I10 Essential (primary) hypertension: Secondary | ICD-10-CM

## 2017-10-14 DIAGNOSIS — Z3049 Encounter for surveillance of other contraceptives: Secondary | ICD-10-CM

## 2017-10-14 LAB — POCT PREGNANCY, URINE: Preg Test, Ur: NEGATIVE

## 2017-10-14 MED ORDER — ETONOGESTREL 68 MG ~~LOC~~ IMPL
68.0000 mg | DRUG_IMPLANT | Freq: Once | SUBCUTANEOUS | Status: AC
  Administered 2017-10-14: 68 mg via SUBCUTANEOUS

## 2017-10-14 NOTE — Patient Instructions (Addendum)
No sex for 2 weeks unless using a condom. Keep taking Norvasc as prescribed. Establish with a PCP for management of hypertension. Weight loss is advised to BMI of less than 25. Drink at least 8 8-oz glasses of water every day. Exercise at least 3 times a week.

## 2017-10-14 NOTE — Progress Notes (Signed)
Post Partum Exam  Laura Mcneil is a 32 y.o. [phone] female who presents for a postpartum visit. She is 8 wks postpartum following a vaginal delivery. I have fully reviewed the prenatal and intrapartum course. The delivery was at 34 gestational weeks.  Anesthesia: epidural. Postpartum course has been unremarkable. Baby's course has been spent 10 days in NICU. Baby is feeding by bottle. Bleeding none. Bowel function is normal. Bladder function is normal Patient is not sexually active. Contraception method is unsure. Postpartum depression screening:neg  Client does not report any headaches or dizziness.  She confirms she is a nonsmoker.  Reviewed contraceptive choices and side effects of Nexplanon. She does not want any more children and wants to try Nexplanon.  Reviewed the side effects of unexpected vaginal bleeding especially in the first 6 months.  Client has not used Depo previously but does want to try Nexplanon.  The following portions of the patient's history were reviewed and updated as appropriate: allergies, current medications, past family history, past medical history, past social history, past surgical history and problem list. Last pap smear done 04/03/17 and was normal. LMP 10/02/17.  Review of Systems Pertinent items noted in HPI and remainder of comprehensive ROS otherwise negative.    Objective:  unknown if currently breastfeeding.  General:  alert, cooperative and no distress   Breasts:  inspection negative, no nipple discharge or bleeding, no masses or nodularity palpable and deferred  Lungs: clear to auscultation bilaterally  Heart:  regular rate and rhythm, S1, S2 normal, no murmur, click, rub or gallop  Abdomen: soft nontender  Pelvic: deferred                  Nexplanon Insertion Procedure Patient identified, informed consent performed, consent signed.   Patient does understand that irregular bleeding is a very common side effect of this medication. She was advised to  have backup contraception for one week after placement. Pregnancy test in clinic today was negative.  Appropriate time out taken.  Patient's left arm was prepped and draped in the usual sterile fashion. The ruler used to measure and mark insertion area.  Patient was prepped with alcohol swab and then injected with 2 ml of 1% lidocaine.  She was prepped with betadine, Nexplanon removed from packaging,  Device confirmed in needle, then inserted full length of needle and withdrawn per handbook instructions. Nexplanon was able to palpated in the patient's arm; patient palpated the insert herself. There was minimal blood loss.  Patient and provider palpated rod in her arm.  Patient insertion site covered with guaze and a pressure bandage to reduce any bruising.  The patient tolerated the procedure well and was given post procedure instructions. Advised to use condoms for sex for 2 weeks.   Assessment:   Normal postpartum exam. Pap smear not done at today's visit.  Chronic hypertension   Plan:   1. Contraception: Nexplanon - Call if your bleeding is excessive. 2. Hypertension - continue your medication and make an appointment with a primary care provider to continue to manage your hypertension.  Your blood pressure is not down enough to consider stopping your blood pressure medication.  Your medication is currently controlling your blood pressure. 3. Follow up in: 1 year or as needed.   See the After visit summary for additional instructions.

## 2017-10-15 ENCOUNTER — Encounter: Payer: Self-pay | Admitting: *Deleted

## 2019-05-21 ENCOUNTER — Emergency Department (HOSPITAL_COMMUNITY)
Admission: EM | Admit: 2019-05-21 | Discharge: 2019-05-21 | Disposition: A | Discharge status: Home / Self Care | Payer: Medicaid Other | Attending: Emergency Medicine | Admitting: Emergency Medicine

## 2019-05-21 ENCOUNTER — Other Ambulatory Visit: Payer: Self-pay

## 2019-05-21 ENCOUNTER — Encounter (HOSPITAL_COMMUNITY): Payer: Self-pay

## 2019-05-21 DIAGNOSIS — I10 Essential (primary) hypertension: Secondary | ICD-10-CM | POA: Insufficient documentation

## 2019-05-21 DIAGNOSIS — Z20828 Contact with and (suspected) exposure to other viral communicable diseases: Secondary | ICD-10-CM | POA: Insufficient documentation

## 2019-05-21 DIAGNOSIS — R6889 Other general symptoms and signs: Secondary | ICD-10-CM

## 2019-05-21 DIAGNOSIS — M791 Myalgia, unspecified site: Secondary | ICD-10-CM | POA: Insufficient documentation

## 2019-05-21 DIAGNOSIS — J029 Acute pharyngitis, unspecified: Secondary | ICD-10-CM | POA: Insufficient documentation

## 2019-05-21 DIAGNOSIS — R509 Fever, unspecified: Secondary | ICD-10-CM | POA: Insufficient documentation

## 2019-05-21 LAB — GROUP A STREP BY PCR: Group A Strep by PCR: NOT DETECTED

## 2019-05-21 LAB — SARS CORONAVIRUS 2 (TAT 6-24 HRS): SARS Coronavirus 2: NEGATIVE

## 2019-05-21 MED ORDER — ACETAMINOPHEN 325 MG PO TABS
650.0000 mg | ORAL_TABLET | Freq: Once | ORAL | Status: AC | PRN
  Administered 2019-05-21: 650 mg via ORAL
  Filled 2019-05-21: qty 2

## 2019-05-21 MED ORDER — DEXAMETHASONE SODIUM PHOSPHATE 10 MG/ML IJ SOLN
10.0000 mg | Freq: Once | INTRAMUSCULAR | Status: AC
  Administered 2019-05-21: 12:00:00 10 mg via INTRAMUSCULAR
  Filled 2019-05-21: qty 1

## 2019-05-21 NOTE — ED Provider Notes (Signed)
Emergency Department Provider Note   I have reviewed the triage vital signs and the nursing notes.   HISTORY  Chief Complaint Sore Throat, Generalized Body Aches, and Fever   HPI Laura Mcneil is a 33 y.o. female with past medical history reviewed below presents to the emergency department with sore throat, body aches, fever.  Symptoms began yesterday and have been persistent.  She denies shortness of breath or chest pain.  No known sick contacts.  Denies travel.  She is not having vomiting or diarrhea.  No abdominal pain.  She has been trying over-the-counter medications with no relief in symptoms.  Denies any lightheadedness.  She does note a mild headache.  Past Medical History:  Diagnosis Date  . Chronic hypertension affecting pregnancy 04/03/2017   [X]  Aspirin 81 mg daily after 12 weeks; discontinue after 36 weeks Current antihypertensives:  labetalol   Baseline and surveillance labs (pulled in from [facility], refresh links as needed)  Lab Results Component Value Date  PLT 202 04/03/2017  CREATININE 0.76 04/03/2017  AST 15 04/03/2017  ALT 9 04/03/2017 U P:C  79 (04/03/2017)  Antenatal Testing CHTN - O10.919  Group I  BP < 140/90, no preeclampsia, A  . Chronic hypertension with superimposed pre-eclampsia 08/08/2017  . GBS (group B Streptococcus carrier), +RV culture, currently pregnant 08/11/2017  . Hypertension   . Pre-eclampsia 08/09/2017    Patient Active Problem List   Diagnosis Date Noted  . Obesity (BMI 35.0-39.9 without comorbidity) 06/10/2017    Past Surgical History:  Procedure Laterality Date  . NO PAST SURGERIES      Allergies Patient has no known allergies.  Family History  Problem Relation Age of Onset  . Hypertension Mother   . Cancer Mother   . Heart disease Mother     Social History Social History   Tobacco Use  . Smoking status: Never Smoker  . Smokeless tobacco: Never Used  Substance Use Topics  . Alcohol use: No    Comment: occ  . Drug use: No    Review of Systems  Constitutional: Positive fever/chills and body aches.  Eyes: No visual changes. ENT: Positive sore throat. Cardiovascular: Denies chest pain. Respiratory: Denies shortness of breath. Gastrointestinal: No abdominal pain.  No nausea, no vomiting.  No diarrhea.  No constipation. Genitourinary: Negative for dysuria. Musculoskeletal: Negative for back pain. Skin: Negative for rash. Neurological: Negative for focal weakness or numbness. Positive HA.   10-point ROS otherwise negative.  ____________________________________________   PHYSICAL EXAM:  VITAL SIGNS: ED Triage Vitals  Enc Vitals Group     BP 05/21/19 0927 (!) 164/99     Pulse Rate 05/21/19 0927 (!) 106     Resp 05/21/19 0927 16     Temp 05/21/19 0927 (!) 102.2 F (39 C)     Temp Source 05/21/19 0927 Oral     SpO2 05/21/19 0927 99 %     Weight 05/21/19 0940 275 lb (124.7 kg)     Height 05/21/19 0939 5\' 11"  (1.803 m)   Constitutional: Alert and oriented. Well appearing and in no acute distress. Eyes: Conjunctivae are normal. Head: Atraumatic. Nose: No congestion/rhinnorhea. Mouth/Throat: Mucous membranes are moist.  Bilaterally enlarged tonsils with exudate.  No peritonsillar abscess.  No trismus.  Submandibular and cervical lymph nodes are enlarged.  Soft submandibular compartment.  Neck: No stridor.   Cardiovascular: Tachycardia. Good peripheral circulation. Grossly normal heart sounds.   Respiratory: Normal respiratory effort.  No retractions. Lungs CTAB. Gastrointestinal: No distention.  Musculoskeletal:  No lower extremity tenderness nor edema. No gross deformities of extremities. Neurologic:  Normal speech and language.  Skin:  Skin is warm, dry and intact. No rash noted.  ____________________________________________   LABS (all labs ordered are listed, but only abnormal results are displayed)  Labs Reviewed  GROUP A STREP BY PCR  SARS CORONAVIRUS 2 (TAT 6-24 HRS)    ____________________________________________   PROCEDURES  Procedure(s) performed:   Procedures  None  ____________________________________________   INITIAL IMPRESSION / ASSESSMENT AND PLAN / ED COURSE  Pertinent labs & imaging results that were available during my care of the patient were reviewed by me and considered in my medical decision making (see chart for details).   Patient presents to the emergency department for evaluation of sore throat, body aches, fever.  Patient is very well-appearing.  Tachycardia is likely related to her fever.  Plan for Tylenol, or strep PCR, and reassess.  Considering COVID-19 but given the appearance of her tonsils this is less likely.   Strep negative. Will test for COVID. Patient to self-isolate and follow results in MyChart. Discussed ED follow up plan.   ____________________________________________  FINAL CLINICAL IMPRESSION(S) / ED DIAGNOSES  Final diagnoses:  Flu-like symptoms  Pharyngitis, unspecified etiology     MEDICATIONS GIVEN DURING THIS VISIT:  Medications  acetaminophen (TYLENOL) tablet 650 mg (650 mg Oral Given 05/21/19 0958)  dexamethasone (DECADRON) injection 10 mg (10 mg Intramuscular Given 05/21/19 1215)    Note:  This document was prepared using Dragon voice recognition software and may include unintentional dictation errors.  Alona BeneJoshua Lavarius Doughten, MD, [facility] Emergency Medicine    Kaina Orengo, Arlyss RepressJoshua G, MD 05/21/19 [phone]

## 2019-05-21 NOTE — ED Triage Notes (Signed)
Patient c/o sore throat, body aches, and fever since yesterday.

## 2019-05-21 NOTE — Discharge Instructions (Signed)
You were seen in the emergency department today with sore throat and fever.  Your strep test was negative.  I am testing you for COVID-19.  Please isolate at home until your symptoms resolve and you have a negative COVID test.  Take Tylenol and or Motrin for fever and discomfort.  Drink plenty of fluids.  You can follow the results of your COVID-19 test in the MyChart app.  Information provided in this discharge paperwork regarding setting up this account.  Return to the emergency department any new or suddenly worsening symptoms.

## 2019-10-21 ENCOUNTER — Encounter (HOSPITAL_COMMUNITY): Payer: Self-pay | Admitting: Emergency Medicine

## 2019-10-21 ENCOUNTER — Other Ambulatory Visit: Payer: Self-pay

## 2019-10-21 ENCOUNTER — Emergency Department (HOSPITAL_COMMUNITY)
Admission: EM | Admit: 2019-10-21 | Discharge: 2019-10-21 | Disposition: A | Discharge status: Home / Self Care | Payer: No Typology Code available for payment source | Attending: Emergency Medicine | Admitting: Emergency Medicine

## 2019-10-21 ENCOUNTER — Emergency Department (HOSPITAL_COMMUNITY): Payer: No Typology Code available for payment source

## 2019-10-21 DIAGNOSIS — R519 Headache, unspecified: Secondary | ICD-10-CM

## 2019-10-21 DIAGNOSIS — R0789 Other chest pain: Secondary | ICD-10-CM | POA: Diagnosis not present

## 2019-10-21 DIAGNOSIS — E876 Hypokalemia: Secondary | ICD-10-CM | POA: Diagnosis not present

## 2019-10-21 DIAGNOSIS — I10 Essential (primary) hypertension: Secondary | ICD-10-CM | POA: Diagnosis not present

## 2019-10-21 LAB — BASIC METABOLIC PANEL
Anion gap: 9 (ref 5–15)
BUN: 9 mg/dL (ref 6–20)
CO2: 27 mmol/L (ref 22–32)
Calcium: 8.8 mg/dL — ABNORMAL LOW (ref 8.9–10.3)
Chloride: 105 mmol/L (ref 98–111)
Creatinine, Ser: 0.8 mg/dL (ref 0.44–1.00)
GFR calc Af Amer: >60 mL/min (ref >60)
GFR calc non Af Amer: >60 mL/min (ref >60)
Glucose, Bld: 89 mg/dL (ref 70–99)
Potassium: 2.9 mmol/L — ABNORMAL LOW (ref 3.5–5.1)
Sodium: 141 mmol/L (ref 135–145)

## 2019-10-21 LAB — CBC
HCT: 35.5 % — ABNORMAL LOW (ref 36.0–46.0)
Hemoglobin: 11.2 g/dL — ABNORMAL LOW (ref 12.0–15.0)
MCH: 26.7 pg (ref 26.0–34.0)
MCHC: 31.5 g/dL (ref 30.0–36.0)
MCV: 84.5 fL (ref 80.0–100.0)
Platelets: 245 10*3/uL (ref 150–400)
RBC: 4.2 MIL/uL (ref 3.87–5.11)
RDW: 15.1 % (ref 11.5–15.5)
WBC: 3.3 10*3/uL — ABNORMAL LOW (ref 4.0–10.5)
nRBC: 0 % (ref 0.0–0.2)

## 2019-10-21 LAB — MAGNESIUM: Magnesium: 2.1 mg/dL (ref 1.7–2.4)

## 2019-10-21 LAB — I-STAT BETA HCG BLOOD, ED (MC, WL, AP ONLY): I-stat hCG, quantitative: <5 m[IU]/mL (ref <5)

## 2019-10-21 LAB — TROPONIN I (HIGH SENSITIVITY)
Troponin I (High Sensitivity): 6 ng/L (ref <18)
Troponin I (High Sensitivity): 5 ng/L (ref <18)

## 2019-10-21 MED ORDER — AMLODIPINE BESYLATE 10 MG PO TABS: 10.0000 mg | ORAL_TABLET | Freq: Every day | ORAL | 0 refills | Status: DC

## 2019-10-21 MED ORDER — POTASSIUM CHLORIDE CRYS ER 20 MEQ PO TBCR: 20.0000 meq | EXTENDED_RELEASE_TABLET | Freq: Every day | ORAL | 0 refills | Status: DC

## 2019-10-21 MED ORDER — POTASSIUM CHLORIDE 10 MEQ/100ML IV SOLN
10.0000 meq | Freq: Once | INTRAVENOUS | Status: AC
  Administered 2019-10-21: 10 meq via INTRAVENOUS
  Filled 2019-10-21: qty 100

## 2019-10-21 MED ORDER — SODIUM CHLORIDE 0.9% FLUSH
3.0000 mL | Freq: Once | INTRAVENOUS | Status: AC
  Administered 2019-10-21: 3 mL via INTRAVENOUS

## 2019-10-21 MED ORDER — POTASSIUM CHLORIDE CRYS ER 20 MEQ PO TBCR
40.0000 meq | EXTENDED_RELEASE_TABLET | Freq: Once | ORAL | Status: AC
  Administered 2019-10-21: 40 meq via ORAL
  Filled 2019-10-21: qty 2

## 2019-10-21 MED ORDER — ACETAMINOPHEN 500 MG PO TABS
1000.0000 mg | ORAL_TABLET | Freq: Once | ORAL | Status: AC
  Administered 2019-10-21: 1000 mg via ORAL
  Filled 2019-10-21: qty 2

## 2019-10-21 NOTE — ED Triage Notes (Signed)
Patient here from home with complaints of chest pain and headache constant x2 days. Denies n/v. Reports mid upper chest pain nonradiating.

## 2019-10-21 NOTE — Discharge Instructions (Signed)
Please take your blood pressure medication as prescribed.  Your potassium is low, take supplementation as well.  Follow-up closely with your primary care doctor for further care.  Return if you have any concern.

## 2019-10-21 NOTE — ED Provider Notes (Signed)
[facility]-EMERGENCY DEPT Provider Note   CSN: [id number] Arrival date & time: 10/21/19  6160     History Chief Complaint  Patient presents with  . Headache  . Chest Pain    Laura Mcneil is a 34 y.o. female.  The history is provided by the patient and medical records. No language interpreter was used.  Headache Chest Pain Associated symptoms: headache      34 year old female with history of hypertension presenting complaining of chest pain and headache. Patient reports that the past 2 to 3 days she has had recurrent chest pain.  She described pain as an intermittent sharp sensation sometimes brought on by breathing, mild to moderate in severity rates a 6 out of 10 that happens sporadically.  Chest pain is not related to exertion no report of nausea vomiting diaphoresis lightheadedness or dizziness with her complaint.  Headaches described as a throbbing headache sometimes worse on the left side and very similar to her usual chronic headache.  Pain in the chest is more in her mid upper chest and nonradiating.  No report of fever or chills no productive cough runny nose sneezing sore throat loss of taste or smell back pain abdominal pain.  She does report family history of heart disease.  She denies alcohol or tobacco use.  She did admit that she ran out of her blood pressure medication approximately a week ago and today when she checked her blood pressure it was elevated at 170 systolic.  She denies any prior history of PE or DVT no recent surgery, prolonged bedrest, or birth control use, or active cancer.  Past Medical History:  Diagnosis Date  . Chronic hypertension affecting pregnancy 04/03/2017   [X]  Aspirin 81 mg daily after 12 weeks; discontinue after 36 weeks Current antihypertensives:  labetalol   Baseline and surveillance labs (pulled in from [facility], refresh links as needed)  Lab Results Component Value Date  PLT 202 04/03/2017  CREATININE 0.76 04/03/2017   AST 15 04/03/2017  ALT 9 04/03/2017 U P:C  79 (04/03/2017)  Antenatal Testing CHTN - O10.919  Group I  BP < 140/90, no preeclampsia, A  . Chronic hypertension with superimposed pre-eclampsia 08/08/2017  . GBS (group B Streptococcus carrier), +RV culture, currently pregnant 08/11/2017  . Hypertension   . Pre-eclampsia 08/09/2017    Patient Active Problem List   Diagnosis Date Noted  . Obesity (BMI 35.0-39.9 without comorbidity) 06/10/2017    Past Surgical History:  Procedure Laterality Date  . NO PAST SURGERIES       OB History    Gravida  2   Para  1   Term      Preterm  1   AB  1   Living  1     SAB      TAB      Ectopic      Multiple  0   Live Births  1           Family History  Problem Relation Age of Onset  . Hypertension Mother   . Cancer Mother   . Heart disease Mother     Social History   Tobacco Use  . Smoking status: Never Smoker  . Smokeless tobacco: Never Used  Substance Use Topics  . Alcohol use: No    Comment: occ  . Drug use: No    Home Medications Prior to Admission medications   Medication Sig Start Date End Date Taking? Authorizing Provider  amLODipine (NORVASC) 10  MG tablet Take 1 tablet (10 mg total) by mouth daily. 08/20/17   Chancy Milroy, MD  ibuprofen (ADVIL,MOTRIN) 600 MG tablet Take 1 tablet (600 mg total) by mouth every 6 (six) hours as needed for headache, mild pain or cramping. Patient not taking: Reported on 10/14/2017 08/19/17   Chancy Milroy, MD  Prenatal Multivit-Min-Fe-FA (PRENATAL VITAMINS) 0.8 MG tablet Take 1 tablet by mouth daily. Patient not taking: Reported on 10/14/2017 04/11/17   Chancy Milroy, MD    Allergies    Patient has no known allergies.  Review of Systems   Review of Systems  Cardiovascular: Positive for chest pain.  Neurological: Positive for headaches.  All other systems reviewed and are negative.   Physical Exam Updated Vital Signs BP (!) 168/108 (BP Location: Left Arm)   Pulse  71   Temp 98.4 F (36.9 C) (Oral)   Resp 19   SpO2 100%   Physical Exam Vitals and nursing note reviewed.  Constitutional:      General: She is not in acute distress.    Appearance: She is well-developed.  HENT:     Head: Atraumatic.  Eyes:     Conjunctiva/sclera: Conjunctivae normal.  Cardiovascular:     Rate and Rhythm: Normal rate and regular rhythm.     Heart sounds: Normal heart sounds.  Pulmonary:     Effort: Pulmonary effort is normal.     Breath sounds: Normal breath sounds. No wheezing, rhonchi or rales.  Abdominal:     Palpations: Abdomen is soft.     Tenderness: There is no abdominal tenderness.  Musculoskeletal:        General: No swelling. Normal range of motion.     Cervical back: Neck supple. No rigidity.  Skin:    Findings: No rash.  Neurological:     Mental Status: She is alert and oriented to person, place, and time.     GCS: GCS eye subscore is 4. GCS verbal subscore is 5. GCS motor subscore is 6.     Cranial Nerves: Cranial nerves are intact.     Sensory: Sensation is intact.     Motor: Motor function is intact.  Psychiatric:        Mood and Affect: Mood normal.     ED Results / Procedures / Treatments   Labs (all labs ordered are listed, but only abnormal results are displayed) Labs Reviewed  BASIC METABOLIC PANEL - Abnormal; Notable for the following components:      Result Value   Potassium 2.9 (*)    Calcium 8.8 (*)    All other components within normal limits  CBC - Abnormal; Notable for the following components:   WBC 3.3 (*)    Hemoglobin 11.2 (*)    HCT 35.5 (*)    All other components within normal limits  MAGNESIUM  I-STAT BETA HCG BLOOD, ED ([facility], [facility], [facility] ONLY)  TROPONIN I (HIGH SENSITIVITY)  TROPONIN I (HIGH SENSITIVITY)    EKG EKG Interpretation  Date/Time:  Wednesday October 21 2019 09:38:50 EST Ventricular Rate:  73 PR Interval:    QRS Duration: 88 QT Interval:  397 QTC Calculation: 438 R Axis:   63 Text  Interpretation: Sinus rhythm Low voltage, precordial leads Baseline wander in lead(s) V6 No old tracing to compare Confirmed by Dorie Rank [phone]) on 10/21/2019 9:56:03 AM    Date: 10/21/2019  Rate: 73  Rhythm: normal sinus rhythm  QRS Axis: normal  Intervals: normal  ST/T Wave abnormalities: normal  Conduction Disutrbances: none  Narrative Interpretation:   Old EKG Reviewed: No significant changes noted     Radiology DG Chest 2 View  Result Date: 10/21/2019 CLINICAL DATA:  Chest pain EXAM: CHEST - 2 VIEW COMPARISON:  None. FINDINGS: Borderline cardiomegaly. There is no edema, consolidation, effusion, or pneumothorax. No acute osseous finding. IMPRESSION: Borderline cardiomegaly. Clear lungs. Electronically Signed   By: Marnee Spring M.D.   On: 10/21/2019 10:29    Procedures Procedures (including critical care time)  Medications Ordered in ED Medications  sodium chloride flush (NS) 0.9 % injection 3 mL (3 mLs Intravenous Given 10/21/19 1126)  potassium chloride SA (KLOR-CON) CR tablet 40 mEq (40 mEq Oral Given 10/21/19 1124)  potassium chloride 10 mEq in 100 mL IVPB (0 mEq Intravenous Stopped 10/21/19 1247)  acetaminophen (TYLENOL) tablet 1,000 mg (1,000 mg Oral Given 10/21/19 1124)    ED Course  I have reviewed the triage vital signs and the nursing notes.  Pertinent labs & imaging results that were available during my care of the patient were reviewed by me and considered in my medical decision making (see chart for details).    MDM Rules/Calculators/A&P                      BP (!) 174/106   Pulse 62   Temp 98.4 F (36.9 C) (Oral)   Resp 19   SpO2 100%   Final Clinical Impression(s) / ED Diagnoses Final diagnoses:  Bad headache  Atypical chest pain  Hypokalemia  Essential hypertension    Rx / DC Orders ED Discharge Orders         Ordered    amLODipine (NORVASC) 10 MG tablet  Daily     10/21/19 1319    potassium chloride SA (KLOR-CON) 20 MEQ tablet  Daily      10/21/19 1319         9:56 AM Patient here with intermittent sharp chest pain that sounds atypical of ACS.  She is PERC negative, low suspicion for PE.  She endorsed having some recurrent headache but no red flags and headache appears to be chronic in nature.  Will perform screening test.  Pain medication offered but patient declined.  Plan to represcribe her home blood pressure medication if work-up unremarkable.  1:14 PM Negative delta troponin.  Patient does have hypokalemia with a potassium of 2.9, supplementation given.  Normal magnesium level.  Chest x-ray unremarkable.  I will represcribe her blood pressure medication, as well as provide potassium supplementation for the next few days and encourage patient to follow-up with primary care provider for further care. HEART score of zero, low risk of MACE.   Fayrene Helper, PA-C 10/21/19 1321    Linwood Dibbles, MD 10/22/19 [phone]

## 2019-11-06 ENCOUNTER — Ambulatory Visit: Payer: No Typology Code available for payment source | Admitting: Internal Medicine

## 2019-12-10 ENCOUNTER — Ambulatory Visit: Payer: No Typology Code available for payment source | Admitting: Internal Medicine

## 2019-12-27 ENCOUNTER — Ambulatory Visit (HOSPITAL_COMMUNITY)
Admission: EM | Admit: 2019-12-27 | Discharge: 2019-12-27 | Disposition: A | Discharge status: Home / Self Care | Payer: No Typology Code available for payment source | Attending: Family Medicine | Admitting: Family Medicine

## 2019-12-27 ENCOUNTER — Encounter (HOSPITAL_COMMUNITY): Payer: Self-pay

## 2019-12-27 ENCOUNTER — Other Ambulatory Visit: Payer: Self-pay

## 2019-12-27 DIAGNOSIS — I10 Essential (primary) hypertension: Secondary | ICD-10-CM

## 2019-12-27 MED ORDER — AMLODIPINE BESYLATE 10 MG PO TABS: 10.0000 mg | ORAL_TABLET | Freq: Every day | ORAL | 3 refills | Status: AC

## 2019-12-27 NOTE — ED Triage Notes (Signed)
Pt states she has been out of her amlodipine 10mg  for a week and her bp was 158/99 when she took it this morning. Pt c/o HA. Pt denies vision changes.

## 2019-12-27 NOTE — ED Provider Notes (Signed)
[facility] [place]    CSN: [id number] Arrival date & time: 12/27/19  1455      History   Chief Complaint Chief Complaint  Patient presents with  . Hypertension    HPI Laura Mcneil is a 34 y.o. female.   Initial [facility] patient visit  Pt states she has been out of her amlodipine 10mg  for a week and her bp was 158/99 when she took it this morning. Pt c/o HA. Pt denies vision changes.       Past Medical History:  Diagnosis Date  . Chronic hypertension affecting pregnancy 04/03/2017   [X]  Aspirin 81 mg daily after 12 weeks; discontinue after 36 weeks Current antihypertensives:  labetalol   Baseline and surveillance labs (pulled in from [facility], refresh links as needed)  Lab Results Component Value Date  PLT 202 04/03/2017  CREATININE 0.76 04/03/2017  AST 15 04/03/2017  ALT 9 04/03/2017 U P:C  79 (04/03/2017)  Antenatal Testing CHTN - O10.919  Group I  BP < 140/90, no preeclampsia, A  . Chronic hypertension with superimposed pre-eclampsia 08/08/2017  . GBS (group B Streptococcus carrier), +RV culture, currently pregnant 08/11/2017  . Hypertension   . Pre-eclampsia 08/09/2017    Patient Active Problem List   Diagnosis Date Noted  . Obesity (BMI 35.0-39.9 without comorbidity) 06/10/2017    Past Surgical History:  Procedure Laterality Date  . NO PAST SURGERIES      OB History    Gravida  2   Para  1   Term      Preterm  1   AB  1   Living  1     SAB      TAB      Ectopic      Multiple  0   Live Births  1            Home Medications    Prior to Admission medications   Medication Sig Start Date End Date Taking? Authorizing Provider  ferrous sulfate 325 (65 FE) MG tablet Take 325 mg by mouth daily with breakfast.   Yes [provider]  amLODipine (NORVASC) 10 MG tablet Take 1 tablet (10 mg total) by mouth daily. 10/21/19   Domenic Moras, PA-C  hydrochlorothiazide (HYDRODIURIL) 50 MG tablet Take 50 mg by mouth daily. 09/01/19   [provider]  potassium chloride SA (KLOR-CON) 20 MEQ tablet Take 1 tablet (20 mEq total) by mouth daily. 10/21/19   Domenic Moras, PA-C    Family History Family History  Problem Relation Age of Onset  . Hypertension Mother   . Cancer Mother   . Heart disease Mother     Social History Social History   Tobacco Use  . Smoking status: Never Smoker  . Smokeless tobacco: Never Used  Substance Use Topics  . Alcohol use: No    Comment: occ  . Drug use: No     Allergies   Patient has no known allergies.   Review of Systems Review of Systems  Respiratory: Negative for chest tightness.   Cardiovascular: Negative for chest pain.  Neurological: Positive for headaches.  All other systems reviewed and are negative.    Physical Exam Triage Vital Signs ED Triage Vitals  Enc Vitals Group     BP 12/27/19 1528 139/81     Pulse Rate 12/27/19 1528 73     Resp 12/27/19 1528 18     Temp 12/27/19 1528 98.3 F (36.8 C)     Temp Source  12/27/19 1528 Oral     SpO2 12/27/19 1528 100 %     Weight 12/27/19 1531 270 lb (122.5 kg)     Height 12/27/19 1531 5\' 11"  (1.803 m)     Head Circumference --      Peak Flow --      Pain Score 12/27/19 1531 8     Pain Loc --      Pain Edu? --      Excl. in GC? --    No data found.  Updated Vital Signs BP 139/81   Pulse 73   Temp 98.3 F (36.8 C) (Oral)   Resp 18   Ht 5\' 11"  (1.803 m)   Wt 122.5 kg   SpO2 100%   BMI 37.66 kg/m    Physical Exam Vitals and nursing note reviewed.  Constitutional:      General: She is not in acute distress.    Appearance: Normal appearance. She is obese. She is not ill-appearing.  HENT:     Head: Normocephalic.     Nose: Nose normal.  Eyes:     Conjunctiva/sclera: Conjunctivae normal.  Cardiovascular:     Rate and Rhythm: Normal rate and regular rhythm.     Heart sounds: Normal heart sounds.  Pulmonary:     Effort: Pulmonary effort is normal.     Breath sounds: Normal breath sounds.  Musculoskeletal:         General: Normal range of motion.     Cervical back: Normal range of motion and neck supple.  Skin:    General: Skin is warm and dry.  Neurological:     General: No focal deficit present.     Mental Status: She is alert and oriented to person, place, and time.  Psychiatric:        Mood and Affect: Mood normal.        Behavior: Behavior normal.        Thought Content: Thought content normal.        Judgment: Judgment normal.      UC Treatments / Results  Labs (all labs ordered are listed, but only abnormal results are displayed) Labs Reviewed - No data to display  EKG   Radiology No results found.  Procedures Procedures (including critical care time)  Medications Ordered in UC Medications - No data to display  Initial Impression / Assessment and Plan / UC Course  I have reviewed the triage vital signs and the nursing notes.  Pertinent labs & imaging results that were available during my care of the patient were reviewed by me and considered in my medical decision making (see chart for details).     Final Clinical Impressions(s) / UC Diagnoses   Final diagnoses:  None   Discharge Instructions   None    ED Prescriptions    None     PDMP not reviewed this encounter.   12/29/19, MD 12/27/19 [phone]

## 2020-09-29 ENCOUNTER — Ambulatory Visit: Payer: No Typology Code available for payment source | Admitting: Internal Medicine

## 2020-10-02 DIAGNOSIS — R519 Headache, unspecified: Secondary | ICD-10-CM | POA: Diagnosis not present

## 2020-10-02 DIAGNOSIS — R059 Cough, unspecified: Secondary | ICD-10-CM | POA: Diagnosis not present

## 2020-10-02 DIAGNOSIS — M791 Myalgia, unspecified site: Secondary | ICD-10-CM | POA: Diagnosis not present

## 2020-10-02 DIAGNOSIS — R5383 Other fatigue: Secondary | ICD-10-CM | POA: Diagnosis not present

## 2021-02-01 DIAGNOSIS — I10 Essential (primary) hypertension: Secondary | ICD-10-CM | POA: Diagnosis not present

## 2021-02-01 DIAGNOSIS — F419 Anxiety disorder, unspecified: Secondary | ICD-10-CM | POA: Diagnosis not present

## 2021-02-01 DIAGNOSIS — J0130 Acute sphenoidal sinusitis, unspecified: Secondary | ICD-10-CM | POA: Diagnosis not present

## 2021-02-28 ENCOUNTER — Encounter (HOSPITAL_COMMUNITY): Payer: Self-pay | Admitting: Emergency Medicine

## 2021-02-28 ENCOUNTER — Emergency Department (HOSPITAL_COMMUNITY): Payer: BC Managed Care – PPO

## 2021-02-28 ENCOUNTER — Emergency Department (HOSPITAL_COMMUNITY)
Admission: EM | Admit: 2021-02-28 | Discharge: 2021-03-01 | Disposition: A | Discharge status: Home / Self Care | Payer: BC Managed Care – PPO | Attending: Emergency Medicine | Admitting: Emergency Medicine

## 2021-02-28 DIAGNOSIS — R0789 Other chest pain: Secondary | ICD-10-CM | POA: Diagnosis not present

## 2021-02-28 DIAGNOSIS — E876 Hypokalemia: Secondary | ICD-10-CM | POA: Diagnosis not present

## 2021-02-28 DIAGNOSIS — Z79899 Other long term (current) drug therapy: Secondary | ICD-10-CM | POA: Diagnosis not present

## 2021-02-28 DIAGNOSIS — D649 Anemia, unspecified: Secondary | ICD-10-CM | POA: Diagnosis not present

## 2021-02-28 DIAGNOSIS — R079 Chest pain, unspecified: Secondary | ICD-10-CM | POA: Diagnosis not present

## 2021-02-28 DIAGNOSIS — Z20822 Contact with and (suspected) exposure to covid-19: Secondary | ICD-10-CM | POA: Insufficient documentation

## 2021-02-28 DIAGNOSIS — I10 Essential (primary) hypertension: Secondary | ICD-10-CM | POA: Insufficient documentation

## 2021-02-28 DIAGNOSIS — I517 Cardiomegaly: Secondary | ICD-10-CM | POA: Diagnosis not present

## 2021-02-28 LAB — CBC
HCT: 31.4 % — ABNORMAL LOW (ref 36.0–46.0)
Hemoglobin: 9.9 g/dL — ABNORMAL LOW (ref 12.0–15.0)
MCH: 25.8 pg — ABNORMAL LOW (ref 26.0–34.0)
MCHC: 31.5 g/dL (ref 30.0–36.0)
MCV: 82 fL (ref 80.0–100.0)
Platelets: 246 10*3/uL (ref 150–400)
RBC: 3.83 MIL/uL — ABNORMAL LOW (ref 3.87–5.11)
RDW: 15.9 % — ABNORMAL HIGH (ref 11.5–15.5)
WBC: 5.4 10*3/uL (ref 4.0–10.5)
nRBC: 0 % (ref 0.0–0.2)

## 2021-02-28 LAB — I-STAT BETA HCG BLOOD, ED (MC, WL, AP ONLY): I-stat hCG, quantitative: <5 m[IU]/mL (ref <5)

## 2021-02-28 LAB — BASIC METABOLIC PANEL
Anion gap: 7 (ref 5–15)
BUN: 11 mg/dL (ref 6–20)
CO2: 25 mmol/L (ref 22–32)
Calcium: 8.8 mg/dL — ABNORMAL LOW (ref 8.9–10.3)
Chloride: 105 mmol/L (ref 98–111)
Creatinine, Ser: 0.81 mg/dL (ref 0.44–1.00)
GFR, Estimated: >60 mL/min (ref >60)
Glucose, Bld: 105 mg/dL — ABNORMAL HIGH (ref 70–99)
Potassium: 2.8 mmol/L — ABNORMAL LOW (ref 3.5–5.1)
Sodium: 137 mmol/L (ref 135–145)

## 2021-02-28 LAB — TROPONIN I (HIGH SENSITIVITY): Troponin I (High Sensitivity): 5 ng/L (ref <18)

## 2021-02-28 NOTE — ED Provider Notes (Signed)
Emergency Medicine Provider Triage Evaluation Note  Laura Mcneil , a 35 y.o. female  was evaluated in triage.  Pt complains of chest pain, cough, left arm paresthesias.  Review of Systems  Positive: chest pain, cough, left arm paresthesias. Negative: nv  Physical Exam  BP (!) 153/93 (BP Location: Left Arm)   Pulse 81   Temp 98.6 F (37 C) (Oral)   Resp 18   SpO2 100%  Gen:   Awake, no distress   Resp:  Normal effort  MSK:   Moves extremities without difficulty  Other:  Lungs ctab, heart rrr  Medical Decision Making  Medically screening exam initiated at 9:35 PM.  Appropriate orders placed.  Laura Mcneil was informed that the remainder of the evaluation will be completed by another provider, this initial triage assessment does not replace that evaluation, and the importance of remaining in the ED until their evaluation is complete.     [name] 02/28/21 2135    Gerhard Munch, MD 02/28/21 2330

## 2021-02-28 NOTE — ED Triage Notes (Signed)
Pt from home, has had intermittent chest pain(7/10) and left arm numbness X1 day.  No other symptoms noted at this time.

## 2021-03-01 ENCOUNTER — Emergency Department (HOSPITAL_COMMUNITY): Payer: BC Managed Care – PPO

## 2021-03-01 ENCOUNTER — Other Ambulatory Visit: Payer: Self-pay

## 2021-03-01 DIAGNOSIS — R079 Chest pain, unspecified: Secondary | ICD-10-CM | POA: Diagnosis not present

## 2021-03-01 DIAGNOSIS — I517 Cardiomegaly: Secondary | ICD-10-CM | POA: Diagnosis not present

## 2021-03-01 LAB — RESP PANEL BY RT-PCR (FLU A&B, COVID) ARPGX2
Influenza A by PCR: NEGATIVE
Influenza B by PCR: NEGATIVE
SARS Coronavirus 2 by RT PCR: NEGATIVE

## 2021-03-01 LAB — D-DIMER, QUANTITATIVE: D-Dimer, Quant: 0.54 ug/mL-FEU — ABNORMAL HIGH (ref 0.00–0.50)

## 2021-03-01 LAB — TROPONIN I (HIGH SENSITIVITY): Troponin I (High Sensitivity): 6 ng/L (ref <18)

## 2021-03-01 LAB — MAGNESIUM: Magnesium: 2 mg/dL (ref 1.7–2.4)

## 2021-03-01 MED ORDER — IOHEXOL 350 MG/ML SOLN
63.0000 mL | Freq: Once | INTRAVENOUS | Status: AC | PRN
  Administered 2021-03-01: 63 mL via INTRAVENOUS

## 2021-03-01 MED ORDER — POTASSIUM CHLORIDE 10 MEQ/100ML IV SOLN
10.0000 meq | Freq: Once | INTRAVENOUS | Status: AC
  Administered 2021-03-01: 10 meq via INTRAVENOUS
  Filled 2021-03-01: qty 100

## 2021-03-01 MED ORDER — POTASSIUM CHLORIDE CRYS ER 20 MEQ PO TBCR: EXTENDED_RELEASE_TABLET | ORAL | 0 refills | Status: DC

## 2021-03-01 MED ORDER — POTASSIUM CHLORIDE CRYS ER 20 MEQ PO TBCR
40.0000 meq | EXTENDED_RELEASE_TABLET | Freq: Once | ORAL | Status: AC
  Administered 2021-03-01: 40 meq via ORAL
  Filled 2021-03-01: qty 2

## 2021-03-01 NOTE — ED Notes (Signed)
Patient transported to CT 

## 2021-03-01 NOTE — ED Provider Notes (Signed)
[facility] EMERGENCY DEPARTMENT Provider Note   CSN: [id number] Arrival date & time: 02/28/21  2124     History Chief Complaint  Patient presents with   Chest Pain    Laura Mcneil is a 35 y.o. female.  The history is provided by the patient.  Chest Pain She has history of hypertension and comes in because of midsternal chest discomfort for the last 24 hours.  Discomfort is described as a heavy feeling without radiation.  Discomfort is intermittent.  It will come on without any obvious provocation and last for about an hour before spontaneously going away.  Nothing seems to make it better, nothing makes it worse.  There is no associated dyspnea, nausea, diaphoresis.  She has noted some numbness in her left arm and hand.  She is non-smoker and there is no history of diabetes or hyperlipidemia.  There is a family history of premature coronary atherosclerosis.  She has the Nexplanon implant for contraception.   Past Medical History:  Diagnosis Date   Chronic hypertension affecting pregnancy 04/03/2017   [X]  Aspirin 81 mg daily after 12 weeks; discontinue after 36 weeks Current antihypertensives:  labetalol   Baseline and surveillance labs (pulled in from [facility], refresh links as needed)  Lab Results Component Value Date  PLT 202 04/03/2017  CREATININE 0.76 04/03/2017  AST 15 04/03/2017  ALT 9 04/03/2017 U P:C  79 (04/03/2017)  Antenatal Testing CHTN - O10.919  Group I  BP < 140/90, no preeclampsia, A   Chronic hypertension with superimposed pre-eclampsia 08/08/2017   GBS (group B Streptococcus carrier), +RV culture, currently pregnant 08/11/2017   Hypertension    Pre-eclampsia 08/09/2017    Patient Active Problem List   Diagnosis Date Noted   Obesity (BMI 35.0-39.9 without comorbidity) 06/10/2017    Past Surgical History:  Procedure Laterality Date   NO PAST SURGERIES       OB History     Gravida  2   Para  1   Term      Preterm  1   AB  1   Living  1       SAB      IAB      Ectopic      Multiple  0   Live Births  1           Family History  Problem Relation Age of Onset   Hypertension Mother    Cancer Mother    Heart disease Mother     Social History   Tobacco Use   Smoking status: Never   Smokeless tobacco: Never  Vaping Use   Vaping Use: Never used  Substance Use Topics   Alcohol use: No    Comment: occ   Drug use: No    Home Medications Prior to Admission medications   Medication Sig Start Date End Date Taking? Authorizing Provider  amLODipine (NORVASC) 10 MG tablet Take 1 tablet (10 mg total) by mouth daily. 12/27/19  Yes 12/29/19, MD  ferrous sulfate 325 (65 FE) MG tablet Take 325 mg by mouth daily with breakfast.   Yes [provider]  amoxicillin-clavulanate (AUGMENTIN) 875-125 MG tablet Take 1 tablet by mouth See admin instructions. Bid x 10 days Patient not taking: No sig reported 02/09/21   [provider]  methylPREDNISolone (MEDROL DOSEPAK) 4 MG TBPK tablet Take 4 mg by mouth See admin instructions. 6,5,4,3,2,1 Patient not taking: No sig reported 02/09/21   [provider]  potassium  chloride SA (KLOR-CON) 20 MEQ tablet Take 1 tablet (20 mEq total) by mouth daily. 10/21/19 12/27/19  Fayrene Helper, PA-C    Allergies    Patient has no known allergies.  Review of Systems   Review of Systems  Cardiovascular:  Positive for chest pain.  All other systems reviewed and are negative.  Physical Exam Updated Vital Signs BP 134/90   Pulse 73   Temp 98.6 F (37 C) (Oral)   Resp 20   Ht 5\' 11"  (1.803 m)   Wt 127 kg   SpO2 100%   BMI 39.05 kg/m   Physical Exam Vitals and nursing note reviewed.  35 year old female, resting comfortably and in no acute distress. Vital signs are normal. Oxygen saturation is 100%, which is normal. Head is normocephalic and atraumatic. PERRLA, EOMI. Oropharynx is clear. Neck is nontender and supple without adenopathy or JVD. Back is  nontender and there is no CVA tenderness. Lungs are clear without rales, wheezes, or rhonchi. Chest is nontender. Heart has regular rate and rhythm without murmur. Abdomen is soft, flat, nontender without masses or hepatosplenomegaly and peristalsis is normoactive. Extremities have 1+ edema, full range of motion is present. Skin is warm and dry without rash. Neurologic: Mental status is normal, cranial nerves are intact, there are no motor or sensory deficits.  ED Results / Procedures / Treatments   Labs (all labs ordered are listed, but only abnormal results are displayed) Labs Reviewed  BASIC METABOLIC PANEL - Abnormal; Notable for the following components:      Result Value   Potassium 2.8 (*)    Glucose, Bld 105 (*)    Calcium 8.8 (*)    All other components within normal limits  CBC - Abnormal; Notable for the following components:   RBC 3.83 (*)    Hemoglobin 9.9 (*)    HCT 31.4 (*)    MCH 25.8 (*)    RDW 15.9 (*)    All other components within normal limits  D-DIMER, QUANTITATIVE - Abnormal; Notable for the following components:   D-Dimer, Quant 0.54 (*)    All other components within normal limits  RESP PANEL BY RT-PCR (FLU A&B, COVID) ARPGX2  MAGNESIUM  I-STAT BETA HCG BLOOD, ED ([facility], [facility], [facility] ONLY)  TROPONIN I (HIGH SENSITIVITY)  TROPONIN I (HIGH SENSITIVITY)    EKG EKG Interpretation  Date/Time:  Tuesday February 28 2021 21:32:49 EDT Ventricular Rate:  72 PR Interval:  158 QRS Duration: 82 QT Interval:  372 QTC Calculation: 407 R Axis:   75 Text Interpretation: Normal sinus rhythm Normal ECG When compared with ECG of 10/21/2019, No significant change was found Confirmed by 10/23/2019 (Dione Booze) on 03/01/2021 12:00:50 AM  Radiology DG Chest 2 View  Result Date: 02/28/2021 CLINICAL DATA:  Chest pain. EXAM: CHEST - 2 VIEW COMPARISON:  October 21, 2019 FINDINGS: The cardiac silhouette is mildly enlarged and unchanged in size. Both lungs are clear. The visualized  skeletal structures are unremarkable. IMPRESSION: Stable exam without active cardiopulmonary disease. Electronically Signed   By: October 23, 2019 M.D.   On: 02/28/2021 22:15   CT Angio Chest PE W and/or Wo Contrast  Result Date: 03/01/2021 CLINICAL DATA:  35 year old female with chest pain and left arm numbness for 1 day. EXAM: CT ANGIOGRAPHY CHEST WITH CONTRAST TECHNIQUE: Multidetector CT imaging of the chest was performed using the standard protocol during bolus administration of intravenous contrast. Multiplanar CT image reconstructions and MIPs were obtained to evaluate the vascular anatomy. CONTRAST:  21mL OMNIPAQUE IOHEXOL 350 MG/ML SOLN COMPARISON:  Chest radiographs 02/28/2021. FINDINGS: Cardiovascular: Adequate contrast bolus timing in the pulmonary arterial tree. Mild respiratory motion. No focal filling defect identified in the pulmonary arteries to suggest acute pulmonary embolism. Borderline to mild cardiomegaly. No pericardial effusion. Negative visible aorta. No calcified coronary artery atherosclerosis is evident. Mediastinum/Nodes: Negative.  No lymphadenopathy. Lungs/Pleura: Major airways are patent. There is mild symmetric atelectasis in both lungs, which otherwise appear negative. No pleural effusion. Upper Abdomen: Negative visible liver, spleen, pancreas, left adrenal gland, left kidney and bowel in the upper abdomen. Musculoskeletal: Negative. Review of the MIP images confirms the above findings. IMPRESSION: Negative for acute pulmonary embolus. Borderline to mild cardiomegaly. No acute finding in the chest. Electronically Signed   By: Odessa Fleming M.D.   On: 03/01/2021 06:02    Procedures Procedures   Medications Ordered in ED Medications  potassium chloride 10 mEq in 100 mL IVPB (0 mEq Intravenous Stopped 03/01/21 0335)  potassium chloride SA (KLOR-CON) CR tablet 40 mEq (40 mEq Oral Given 03/01/21 0220)  iohexol (OMNIPAQUE) 350 MG/ML injection 63 mL (63 mLs Intravenous Contrast Given  03/01/21 0552)    ED Course  I have reviewed the triage vital signs and the nursing notes.  Pertinent labs & imaging results that were available during my care of the patient were reviewed by me and considered in my medical decision making (see chart for details).   MDM Rules/Calculators/A&P                         Chest pain which is somewhat atypical.  Risk factors for coronary disease include hypertension, obesity, family history of premature coronary atherosclerosis.  ECG is normal and initial troponin is normal.  Patient risk score per heart pathway is 3 which puts her at low risk for major adverse cardiac events in the next 6 weeks.  Chest x-ray is normal.  Labs are significant for anemia which is slightly worse than it had been 16 months ago.  Also significant for hypokalemia with potassium of 2.8.  She is not on any diuretics.  Curiously, in February 2021, she also had potassium below 3.  Cause for hypokalemia this severe in absence of diuretic use is unclear.  She is pending delta troponin.  Will check magnesium level and also check D-dimer.  She is given IV and oral potassium.  Old records are reviewed, and she does have a prior ED visit for atypical chest pain.  Repeat troponin is normal.  D-dimer has come back mildly elevated and she is sent for CT angiogram of the chest which is no evidence of pulmonary embolism.  She is discharged with prescription for K-Dur and is instructed to follow-up with primary care provider.  I do feel she will need outpatient work-up for possible potassium wasting nephropathy.  Final Clinical Impression(s) / ED Diagnoses Final diagnoses:  Atypical chest pain  Hypokalemia  Normocytic anemia    Rx / DC Orders ED Discharge Orders          Ordered    potassium chloride SA (KLOR-CON) 20 MEQ tablet        03/01/21 1610             Dione Booze, MD 03/01/21 [phone]

## 2021-03-01 NOTE — Discharge Instructions (Addendum)
Your evaluation did not show any of the serious causes of chest pain.  You were found to have a very low potassium.  The reason for that is not clear.  Your primary care provider will probably need to run some additional tests to find out why your potassium is running so low.  In the meantime, take the potassium prescription as prescribed.  Return to the emergency department if you are having any problems.

## 2021-03-10 ENCOUNTER — Encounter (HOSPITAL_COMMUNITY): Payer: Self-pay

## 2021-03-10 ENCOUNTER — Emergency Department (HOSPITAL_COMMUNITY)
Admission: EM | Admit: 2021-03-10 | Discharge: 2021-03-10 | Disposition: A | Discharge status: Home / Self Care | Payer: BC Managed Care – PPO | Attending: Emergency Medicine | Admitting: Emergency Medicine

## 2021-03-10 ENCOUNTER — Other Ambulatory Visit: Payer: Self-pay

## 2021-03-10 DIAGNOSIS — Z79899 Other long term (current) drug therapy: Secondary | ICD-10-CM | POA: Diagnosis not present

## 2021-03-10 DIAGNOSIS — T39015A Adverse effect of aspirin, initial encounter: Secondary | ICD-10-CM | POA: Insufficient documentation

## 2021-03-10 DIAGNOSIS — I10 Essential (primary) hypertension: Secondary | ICD-10-CM | POA: Insufficient documentation

## 2021-03-10 DIAGNOSIS — R202 Paresthesia of skin: Secondary | ICD-10-CM | POA: Diagnosis not present

## 2021-03-10 DIAGNOSIS — T887XXA Unspecified adverse effect of drug or medicament, initial encounter: Secondary | ICD-10-CM | POA: Insufficient documentation

## 2021-03-10 DIAGNOSIS — T50905A Adverse effect of unspecified drugs, medicaments and biological substances, initial encounter: Secondary | ICD-10-CM

## 2021-03-10 LAB — COMPREHENSIVE METABOLIC PANEL
ALT: 16 U/L (ref 0–44)
AST: 15 U/L (ref 15–41)
Albumin: 4.4 g/dL (ref 3.5–5.0)
Alkaline Phosphatase: 59 U/L (ref 38–126)
Anion gap: 10 (ref 5–15)
BUN: 10 mg/dL (ref 6–20)
CO2: 24 mmol/L (ref 22–32)
Calcium: 9.1 mg/dL (ref 8.9–10.3)
Chloride: 103 mmol/L (ref 98–111)
Creatinine, Ser: 0.95 mg/dL (ref 0.44–1.00)
GFR, Estimated: >60 mL/min (ref >60)
Glucose, Bld: 118 mg/dL — ABNORMAL HIGH (ref 70–99)
Potassium: 3 mmol/L — ABNORMAL LOW (ref 3.5–5.1)
Sodium: 137 mmol/L (ref 135–145)
Total Bilirubin: 0.5 mg/dL (ref 0.3–1.2)
Total Protein: 8.9 g/dL — ABNORMAL HIGH (ref 6.5–8.1)

## 2021-03-10 LAB — CBC WITH DIFFERENTIAL/PLATELET
Abs Immature Granulocytes: 0.02 10*3/uL (ref 0.00–0.07)
Basophils Absolute: 0 10*3/uL (ref 0.0–0.1)
Basophils Relative: 0 %
Eosinophils Absolute: 0 10*3/uL (ref 0.0–0.5)
Eosinophils Relative: 0 %
HCT: 34 % — ABNORMAL LOW (ref 36.0–46.0)
Hemoglobin: 10.6 g/dL — ABNORMAL LOW (ref 12.0–15.0)
Immature Granulocytes: 0 %
Lymphocytes Relative: 23 %
Lymphs Abs: 1.7 10*3/uL (ref 0.7–4.0)
MCH: 25.4 pg — ABNORMAL LOW (ref 26.0–34.0)
MCHC: 31.2 g/dL (ref 30.0–36.0)
MCV: 81.3 fL (ref 80.0–100.0)
Monocytes Absolute: 0.3 10*3/uL (ref 0.1–1.0)
Monocytes Relative: 4 %
Neutro Abs: 5.2 10*3/uL (ref 1.7–7.7)
Neutrophils Relative %: 73 %
Platelets: 287 10*3/uL (ref 150–400)
RBC: 4.18 MIL/uL (ref 3.87–5.11)
RDW: 15.4 % (ref 11.5–15.5)
WBC: 7.2 10*3/uL (ref 4.0–10.5)
nRBC: 0 % (ref 0.0–0.2)

## 2021-03-10 LAB — I-STAT BETA HCG BLOOD, ED (MC, WL, AP ONLY): I-stat hCG, quantitative: <5 m[IU]/mL (ref <5)

## 2021-03-10 NOTE — ED Provider Notes (Signed)
Emergency Medicine Provider Triage Evaluation Note  Laura Mcneil , a 35 y.o. female  was evaluated in triage.  Pt complains of medication reaction. States she was trying to take naproxen for a migraine and instead accidentally took topiramate. She took one tablet around 1130am this morning. She then started to feel numbness to her bue/ble, nausea, eye twitching.  Review of Systems  Positive: Numbness, nausea, eye twitching Negative: weakness  Physical Exam  BP (!) 173/101 (BP Location: Left Arm)   Pulse (!) 104   Temp 98.3 F (36.8 C) (Oral)   Resp 16   Ht 5\' 11"  (1.803 m)   Wt 127 kg   SpO2 100%   BMI 39.05 kg/m  Gen:   Awake, no distress   Resp:  Normal effort  MSK:   Moves extremities without difficulty  Other:  5/5 strength to bue/ble, normal sensation, no facial droop, clear speech  Medical Decision Making  Medically screening exam initiated at 4:28 PM.  Appropriate orders placed.  Laura Mcneil was informed that the remainder of the evaluation will be completed by another provider, this initial triage assessment does not replace that evaluation, and the importance of remaining in the ED until their evaluation is complete.     Donavan Foil 03/10/21 1631    05/11/21, MD 03/12/21 1701

## 2021-03-10 NOTE — ED Triage Notes (Signed)
Patient states she was at work and took 1 pill that was in a bottle that read Excedrin. Patient states she took Topomax at [employer] today. Patient c/o nausea and numbness of the face, arms and legs which began 30 minutes after taking, but states that the symptoms have lessened since taking.

## 2021-03-10 NOTE — ED Provider Notes (Signed)
[facility]-EMERGENCY DEPT Provider Note   CSN: [id number] Arrival date & time: 03/10/21  1519     History Chief Complaint  Patient presents with   Medication Reaction    Laura Mcneil is a 35 y.o. female.  Patient was having migraine earlier today but she took Excedrin but realize she took topiramate.  Took a tablet about 6 hours ago.  Started to feel some paresthesias throughout her body and nauseous.  Symptoms of not resolved.  She no longer has headache.  The history is provided by the patient.  Illness Severity:  Mild Onset quality:  Gradual Duration:  6 hours Progression:  Resolved Chronicity:  New Relieved by:  Nothing Worsened by:  Nothing Associated symptoms: headaches   Associated symptoms: no abdominal pain, no chest pain, no cough, no ear pain, no fever, no rash, no shortness of breath, no sore throat and no vomiting       Past Medical History:  Diagnosis Date   Chronic hypertension affecting pregnancy 04/03/2017   [X]  Aspirin 81 mg daily after 12 weeks; discontinue after 36 weeks Current antihypertensives:  labetalol   Baseline and surveillance labs (pulled in from [facility], refresh links as needed)  Lab Results Component Value Date  PLT 202 04/03/2017  CREATININE 0.76 04/03/2017  AST 15 04/03/2017  ALT 9 04/03/2017 U P:C  79 (04/03/2017)  Antenatal Testing CHTN - O10.919  Group I  BP < 140/90, no preeclampsia, A   Chronic hypertension with superimposed pre-eclampsia 08/08/2017   GBS (group B Streptococcus carrier), +RV culture, currently pregnant 08/11/2017   Hypertension    Pre-eclampsia 08/09/2017    Patient Active Problem List   Diagnosis Date Noted   Obesity (BMI 35.0-39.9 without comorbidity) 06/10/2017    Past Surgical History:  Procedure Laterality Date   NO PAST SURGERIES       OB History     Gravida  2   Para  1   Term      Preterm  1   AB  1   Living  1      SAB      IAB      Ectopic      Multiple  0   Live  Births  1           Family History  Problem Relation Age of Onset   Hypertension Mother    Cancer Mother    Heart disease Mother     Social History   Tobacco Use   Smoking status: Never   Smokeless tobacco: Never  Vaping Use   Vaping Use: Never used  Substance Use Topics   Alcohol use: Yes    Comment: occ   Drug use: No    Home Medications Prior to Admission medications   Medication Sig Start Date End Date Taking? Authorizing Provider  amLODipine (NORVASC) 10 MG tablet Take 1 tablet (10 mg total) by mouth daily. 12/27/19   12/29/19, MD  ferrous sulfate 325 (65 FE) MG tablet Take 325 mg by mouth daily with breakfast.    [provider]  potassium chloride SA (KLOR-CON) 20 MEQ tablet Take 1 tablet twice a day for the next 5 days, then continue taking 1 tablet daily 03/01/21   03/03/21, MD    Allergies    Patient has no known allergies.  Review of Systems   Review of Systems  Constitutional:  Negative for chills and fever.  HENT:  Negative for ear pain and sore  throat.   Eyes:  Negative for pain and visual disturbance.  Respiratory:  Negative for cough and shortness of breath.   Cardiovascular:  Negative for chest pain and palpitations.  Gastrointestinal:  Negative for abdominal pain and vomiting.  Genitourinary:  Negative for dysuria and hematuria.  Musculoskeletal:  Negative for arthralgias and back pain.  Skin:  Negative for color change and rash.  Neurological:  Positive for numbness and headaches. Negative for seizures, syncope and weakness.  All other systems reviewed and are negative.  Physical Exam Updated Vital Signs BP (!) 173/101 (BP Location: Left Arm)   Pulse (!) 104   Temp 98.3 F (36.8 C) (Oral)   Resp 16   Ht 5\' 11"  (1.803 m)   Wt 127 kg   SpO2 100%   BMI 39.05 kg/m   Physical Exam Vitals and nursing note reviewed.  Constitutional:      General: She is not in acute distress.    Appearance: She is well-developed. She  is not ill-appearing.  HENT:     Head: Normocephalic and atraumatic.     Mouth/Throat:     Mouth: Mucous membranes are moist.  Eyes:     Extraocular Movements: Extraocular movements intact.     Conjunctiva/sclera: Conjunctivae normal.     Pupils: Pupils are equal, round, and reactive to light.  Cardiovascular:     Rate and Rhythm: Normal rate and regular rhythm.     Pulses: Normal pulses.     Heart sounds: Normal heart sounds. No murmur heard. Pulmonary:     Effort: Pulmonary effort is normal. No respiratory distress.     Breath sounds: Normal breath sounds.  Abdominal:     Palpations: Abdomen is soft.     Tenderness: There is no abdominal tenderness.  Musculoskeletal:     Cervical back: Neck supple.  Skin:    General: Skin is warm and dry.     Capillary Refill: Capillary refill takes less than 2 seconds.  Neurological:     General: No focal deficit present.     Mental Status: She is alert and oriented to person, place, and time.     Cranial Nerves: No cranial nerve deficit.     Sensory: No sensory deficit.     Motor: No weakness.     Coordination: Coordination normal.     Comments: Normal strength and sensation throughout    ED Results / Procedures / Treatments   Labs (all labs ordered are listed, but only abnormal results are displayed) Labs Reviewed  CBC WITH DIFFERENTIAL/PLATELET - Abnormal; Notable for the following components:      Result Value   Hemoglobin 10.6 (*)    HCT 34.0 (*)    MCH 25.4 (*)    All other components within normal limits  COMPREHENSIVE METABOLIC PANEL - Abnormal; Notable for the following components:   Potassium 3.0 (*)    Glucose, Bld 118 (*)    Total Protein 8.9 (*)    All other components within normal limits  I-STAT BETA HCG BLOOD, ED ([facility], [facility], [facility] ONLY)    EKG None  Radiology No results found.  Procedures Procedures   Medications Ordered in ED Medications - No data to display  ED Course  I have reviewed the triage vital  signs and the nursing notes.  Pertinent labs & imaging results that were available during my care of the patient were reviewed by me and considered in my medical decision making (see chart for details).    MDM  Rules/Calculators/A&P                          Laura Mcneil is here with concern for possible medication side effect.  Remarkable vitals.  No fever.  Overall asymptomatic at this time.  Accidentally took a Topamax instead of Excedrin this morning for headache that she had.  No longer having headache.  Shortly after taking the Topamax she states that she got some jitteriness, paresthesias throughout her body and then she started to google side effects and got very nervous.  She feels much better right now.  Overall gave her reassurance that likely medication will wear off and not having any prolonged effects.  She is neurologically intact.  No headaches.  She states that she has a history of migraines.  Given reassurance and discharged in the ED in good condition.  This chart was dictated using voice recognition software.  Despite best efforts to proofread,  errors can occur which can change the documentation meaning.    Final Clinical Impression(s) / ED Diagnoses Final diagnoses:  Adverse effect of drug, initial encounter    Rx / DC Orders ED Discharge Orders     None        Virgina Norfolk, DO 03/10/21 1810

## 2021-08-22 ENCOUNTER — Other Ambulatory Visit: Payer: Self-pay | Admitting: Internal Medicine

## 2021-08-22 DIAGNOSIS — Z23 Encounter for immunization: Secondary | ICD-10-CM | POA: Diagnosis not present

## 2021-08-22 DIAGNOSIS — I10 Essential (primary) hypertension: Secondary | ICD-10-CM | POA: Diagnosis not present

## 2021-08-22 DIAGNOSIS — D6489 Other specified anemias: Secondary | ICD-10-CM | POA: Diagnosis not present

## 2021-08-22 DIAGNOSIS — E559 Vitamin D deficiency, unspecified: Secondary | ICD-10-CM | POA: Diagnosis not present

## 2021-08-22 DIAGNOSIS — E669 Obesity, unspecified: Secondary | ICD-10-CM | POA: Diagnosis not present

## 2021-08-22 DIAGNOSIS — Z0000 Encounter for general adult medical examination without abnormal findings: Secondary | ICD-10-CM | POA: Diagnosis not present

## 2021-08-22 DIAGNOSIS — Z6839 Body mass index (BMI) 39.0-39.9, adult: Secondary | ICD-10-CM | POA: Diagnosis not present

## 2021-08-22 DIAGNOSIS — Z13220 Encounter for screening for lipoid disorders: Secondary | ICD-10-CM | POA: Diagnosis not present

## 2021-08-23 LAB — LIPID PANEL
Cholesterol: 149 mg/dL (ref <200)
HDL: 37 mg/dL — ABNORMAL LOW (ref >=50)
LDL Cholesterol (Calc): 89 mg/dL (calc)
Non-HDL Cholesterol (Calc): 112 mg/dL (calc) (ref <130)
Total CHOL/HDL Ratio: 4 (calc) (ref <5.0)
Triglycerides: 130 mg/dL (ref <150)

## 2021-08-23 LAB — BASIC METABOLIC PANEL WITH GFR
BUN: 10 mg/dL (ref 7–25)
CO2: 25 mmol/L (ref 20–32)
Calcium: 9 mg/dL (ref 8.6–10.2)
Chloride: 104 mmol/L (ref 98–110)
Creat: 0.77 mg/dL (ref 0.50–0.97)
Glucose, Bld: 74 mg/dL (ref 65–99)
Potassium: 3.2 mmol/L — ABNORMAL LOW (ref 3.5–5.3)
Sodium: 138 mmol/L (ref 135–146)
eGFR: 104 mL/min/{1.73_m2} (ref >=60)

## 2021-08-23 LAB — CBC
HCT: 31.1 % — ABNORMAL LOW (ref 35.0–45.0)
Hemoglobin: 10.1 g/dL — ABNORMAL LOW (ref 11.7–15.5)
MCH: 26.2 pg — ABNORMAL LOW (ref 27.0–33.0)
MCHC: 32.5 g/dL (ref 32.0–36.0)
MCV: 80.6 fL (ref 80.0–100.0)
MPV: 11.7 fL (ref 7.5–12.5)
Platelets: 249 10*3/uL (ref 140–400)
RBC: 3.86 10*6/uL (ref 3.80–5.10)
RDW: 15 % (ref 11.0–15.0)
WBC: 4.6 10*3/uL (ref 3.8–10.8)

## 2021-08-23 LAB — FOLATE: Folate: 10.1 ng/mL

## 2021-08-23 LAB — VITAMIN B12: Vitamin B-12: 390 pg/mL (ref 200–1100)

## 2021-08-23 LAB — MAGNESIUM: Magnesium: 2 mg/dL (ref 1.5–2.5)

## 2021-08-23 LAB — FERRITIN: Ferritin: 4 ng/mL — ABNORMAL LOW (ref 16–154)

## 2021-08-23 LAB — VITAMIN D 25 HYDROXY (VIT D DEFICIENCY, FRACTURES): Vit D, 25-Hydroxy: 15 ng/mL — ABNORMAL LOW (ref 30–100)

## 2021-08-23 LAB — SICKLE CELL SCREEN: Sickle Solubility Test - HGBRFX: NEGATIVE

## 2021-08-23 LAB — IRON, TOTAL/TOTAL IRON BINDING CAP
%SAT: 10 % (calc) — ABNORMAL LOW (ref 16–45)
Iron: 36 ug/dL — ABNORMAL LOW (ref 40–190)
TIBC: 375 mcg/dL (calc) (ref 250–450)

## 2021-08-23 LAB — TSH: TSH: 0.49 mIU/L

## 2021-11-06 DIAGNOSIS — I10 Essential (primary) hypertension: Secondary | ICD-10-CM | POA: Diagnosis not present

## 2021-11-06 DIAGNOSIS — M25571 Pain in right ankle and joints of right foot: Secondary | ICD-10-CM | POA: Diagnosis not present

## 2021-11-06 DIAGNOSIS — E669 Obesity, unspecified: Secondary | ICD-10-CM | POA: Diagnosis not present

## 2021-11-16 DIAGNOSIS — M25571 Pain in right ankle and joints of right foot: Secondary | ICD-10-CM | POA: Diagnosis not present

## 2021-11-16 DIAGNOSIS — I10 Essential (primary) hypertension: Secondary | ICD-10-CM | POA: Diagnosis not present

## 2021-11-16 DIAGNOSIS — E669 Obesity, unspecified: Secondary | ICD-10-CM | POA: Diagnosis not present

## 2021-12-14 DIAGNOSIS — E669 Obesity, unspecified: Secondary | ICD-10-CM | POA: Diagnosis not present

## 2021-12-14 DIAGNOSIS — I10 Essential (primary) hypertension: Secondary | ICD-10-CM | POA: Diagnosis not present

## 2021-12-14 DIAGNOSIS — E559 Vitamin D deficiency, unspecified: Secondary | ICD-10-CM | POA: Diagnosis not present

## 2021-12-14 DIAGNOSIS — M25571 Pain in right ankle and joints of right foot: Secondary | ICD-10-CM | POA: Diagnosis not present

## 2022-01-23 DIAGNOSIS — I10 Essential (primary) hypertension: Secondary | ICD-10-CM | POA: Diagnosis not present

## 2022-01-23 DIAGNOSIS — E669 Obesity, unspecified: Secondary | ICD-10-CM | POA: Diagnosis not present

## 2022-01-23 DIAGNOSIS — Z6838 Body mass index (BMI) 38.0-38.9, adult: Secondary | ICD-10-CM | POA: Diagnosis not present

## 2022-02-19 ENCOUNTER — Ambulatory Visit
Admission: EM | Admit: 2022-02-19 | Discharge: 2022-02-19 | Disposition: A | Discharge status: Home / Self Care | Payer: BC Managed Care – PPO | Attending: Internal Medicine | Admitting: Internal Medicine

## 2022-02-19 DIAGNOSIS — R110 Nausea: Secondary | ICD-10-CM

## 2022-02-19 LAB — POCT URINE PREGNANCY: Preg Test, Ur: NEGATIVE

## 2022-02-19 MED ORDER — ONDANSETRON HCL 4 MG PO TABS: 4.0000 mg | ORAL_TABLET | Freq: Three times a day (TID) | ORAL | 0 refills | Status: AC | PRN

## 2022-02-19 NOTE — ED Triage Notes (Signed)
Pt c/o nausea, weakness, lightheaded onset this morning states concerned it's related to HTN.

## 2022-02-19 NOTE — Discharge Instructions (Addendum)
Crease oral fluid intake Take medications as prescribed Return to urgent care if symptoms worsen Urine pregnancy test is negative.

## 2022-02-19 NOTE — ED Provider Notes (Signed)
[facility]    CSN: [id number] Arrival date & time: 02/19/22  1008      History   Chief Complaint Chief Complaint  Patient presents with   Nausea    HPI Laura Mcneil is a 36 y.o. female comes to the urgent care with 1 day history of nausea without vomiting.  Patient woke up this morning with nausea.  This is associated with generalized fatigue.  She denies any fever or chills.  No diarrhea.  No change in dietary habits.  No recent traveling.  No sick contacts.  No rash.  No dizziness, near syncope or syncopal episodes.  No headaches or chest pain.  Last menstrual period was 3 weeks ago.  Patient is sexually active and is engaged in unprotected sexual intercourse.  HPI  Past Medical History:  Diagnosis Date   Chronic hypertension affecting pregnancy 04/03/2017   [X]  Aspirin 81 mg daily after 12 weeks; discontinue after 36 weeks Current antihypertensives:  labetalol   Baseline and surveillance labs (pulled in from [facility], refresh links as needed)  Lab Results Component Value Date  PLT 202 04/03/2017  CREATININE 0.76 04/03/2017  AST 15 04/03/2017  ALT 9 04/03/2017 U P:C  79 (04/03/2017)  Antenatal Testing CHTN - O10.919  Group I  BP < 140/90, no preeclampsia, A   Chronic hypertension with superimposed pre-eclampsia 08/08/2017   GBS (group B Streptococcus carrier), +RV culture, currently pregnant 08/11/2017   Hypertension    Pre-eclampsia 08/09/2017    Patient Active Problem List   Diagnosis Date Noted   Obesity (BMI 35.0-39.9 without comorbidity) 06/10/2017    Past Surgical History:  Procedure Laterality Date   NO PAST SURGERIES      OB History     Gravida  2   Para  1   Term      Preterm  1   AB  1   Living  1      SAB      IAB      Ectopic      Multiple  0   Live Births  1            Home Medications    Prior to Admission medications   Medication Sig Start Date End Date Taking? Authorizing Provider  ondansetron (ZOFRAN) 4 MG tablet Take  1 tablet (4 mg total) by mouth every 8 (eight) hours as needed for nausea or vomiting. 02/19/22  Yes Jelena Malicoat, 02/21/22, MD  amLODipine (NORVASC) 10 MG tablet Take 1 tablet (10 mg total) by mouth daily. 12/27/19   12/29/19, MD  ferrous sulfate 325 (65 FE) MG tablet Take 325 mg by mouth daily with breakfast.    [provider]  potassium chloride SA (KLOR-CON) 20 MEQ tablet Take 1 tablet twice a day for the next 5 days, then continue taking 1 tablet daily 03/01/21   03/03/21, MD    Family History Family History  Problem Relation Age of Onset   Hypertension Mother    Cancer Mother    Heart disease Mother     Social History Social History   Tobacco Use   Smoking status: Never   Smokeless tobacco: Never  Vaping Use   Vaping Use: Never used  Substance Use Topics   Alcohol use: Yes    Comment: occ   Drug use: No     Allergies   Patient has no known allergies.   Review of Systems Review of Systems  HENT: Negative.  Respiratory: Negative.    Gastrointestinal:  Positive for nausea. Negative for abdominal pain and vomiting.  Musculoskeletal: Negative.      Physical Exam Triage Vital Signs ED Triage Vitals [02/19/22 1018]  Enc Vitals Group     BP (!) 150/97     Pulse Rate 80     Resp 18     Temp 98.4 F (36.9 C)     Temp Source Oral     SpO2 98 %     Weight      Height      Head Circumference      Peak Flow      Pain Score 0     Pain Loc      Pain Edu?      Excl. in GC?    No data found.  Updated Vital Signs BP (!) 150/97 (BP Location: Left Arm)   Pulse 80   Temp 98.4 F (36.9 C) (Oral)   Resp 18   SpO2 98%   Breastfeeding No   Visual Acuity Right Eye Distance:   Left Eye Distance:   Bilateral Distance:    Right Eye Near:   Left Eye Near:    Bilateral Near:     Physical Exam Vitals and nursing note reviewed.  Constitutional:      General: She is not in acute distress.    Appearance: She is not ill-appearing.   Cardiovascular:     Rate and Rhythm: Normal rate.     Pulses: Normal pulses.     Heart sounds: Normal heart sounds.  Pulmonary:     Effort: Pulmonary effort is normal.     Breath sounds: Normal breath sounds.  Abdominal:     General: Bowel sounds are normal.     Palpations: Abdomen is soft.  Neurological:     General: No focal deficit present.     Mental Status: She is alert and oriented to person, place, and time.      UC Treatments / Results  Labs (all labs ordered are listed, but only abnormal results are displayed) Labs Reviewed  POCT URINE PREGNANCY    EKG   Radiology No results found.  Procedures Procedures (including critical care time)  Medications Ordered in UC Medications - No data to display  Initial Impression / Assessment and Plan / UC Course  I have reviewed the triage vital signs and the nursing notes.  Pertinent labs & imaging results that were available during my care of the patient were reviewed by me and considered in my medical decision making (see chart for details).     1.  Nausea without vomiting: Zofran as needed for nausea/vomiting Urine pregnancy test is neg Increase oral fluid intake Return to urgent care if symptoms worsen. Final Clinical Impressions(s) / UC Diagnoses   Final diagnoses:  Nausea without vomiting   Discharge Instructions   None    ED Prescriptions     Medication Sig Dispense Auth. Provider   ondansetron (ZOFRAN) 4 MG tablet Take 1 tablet (4 mg total) by mouth every 8 (eight) hours as needed for nausea or vomiting. 20 tablet Jai Steil, Britta Mccreedy, MD      PDMP not reviewed this encounter.   Merrilee Jansky, MD 02/19/22 1102

## 2022-03-15 DIAGNOSIS — E669 Obesity, unspecified: Secondary | ICD-10-CM | POA: Diagnosis not present

## 2022-03-15 DIAGNOSIS — I10 Essential (primary) hypertension: Secondary | ICD-10-CM | POA: Diagnosis not present

## 2022-03-27 DIAGNOSIS — Z975 Presence of (intrauterine) contraceptive device: Secondary | ICD-10-CM | POA: Diagnosis not present

## 2022-03-27 DIAGNOSIS — Z01419 Encounter for gynecological examination (general) (routine) without abnormal findings: Secondary | ICD-10-CM | POA: Diagnosis not present

## 2022-04-25 DIAGNOSIS — Z3046 Encounter for surveillance of implantable subdermal contraceptive: Secondary | ICD-10-CM | POA: Diagnosis not present

## 2022-04-25 DIAGNOSIS — Z30430 Encounter for insertion of intrauterine contraceptive device: Secondary | ICD-10-CM | POA: Diagnosis not present

## 2022-05-01 IMAGING — CT CT ANGIO CHEST
3 of 7 series · 19 of 36 positions shown · IV contrast (omnipaque)
Comparison: Chest radiographs 02/28/2021.

CLINICAL DATA: 34-year-old female with chest pain and left arm
numbness for 1 day.

EXAM:
CT ANGIOGRAPHY CHEST WITH CONTRAST
TECHNIQUE: Multidetector CT imaging of the chest was performed using the
standard protocol during bolus administration of intravenous
contrast. Multiplanar CT image reconstructions and MIPs were
obtained to evaluate the vascular anatomy.
CONTRAST:  63mL OMNIPAQUE IOHEXOL 350 MG/ML SOLN

[Series 6: pe thins · axial · 0.90mm/px · z∈[+1186,+1405]mm · 15 of 360 slices shown]
[im 23/360  lung]
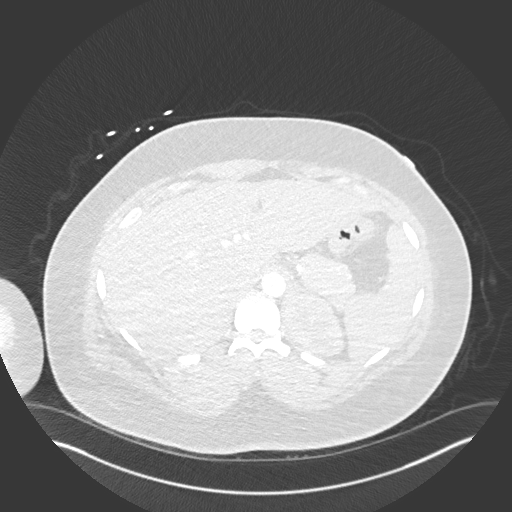
[im 45/360  mediastinal]
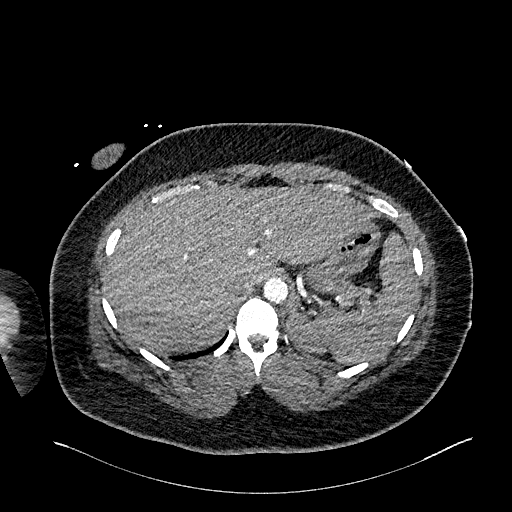
[im 68/360  lung]
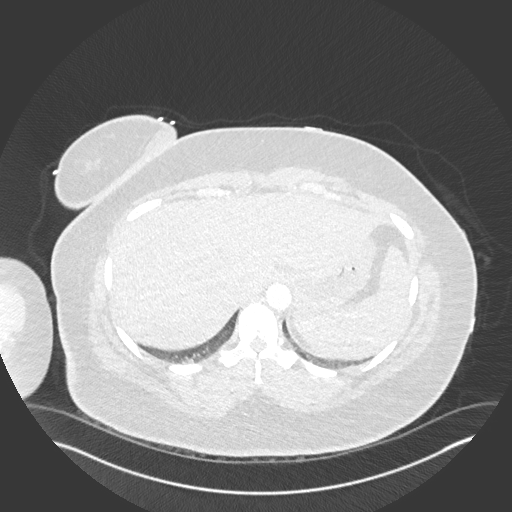
[im 90/360  mediastinal]
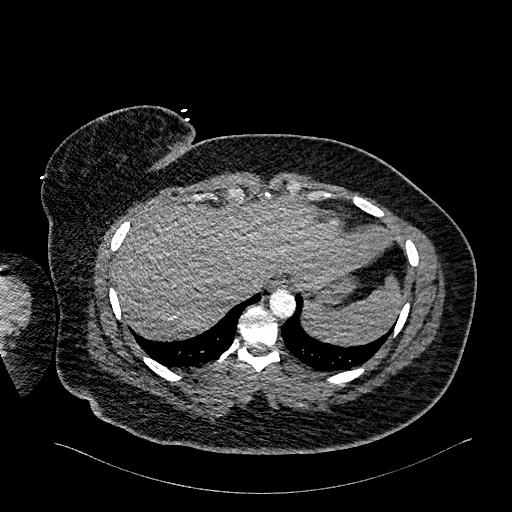
[im 113/360  lung]
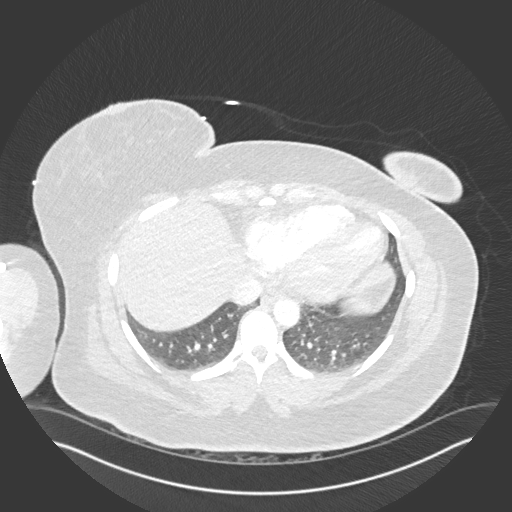
[im 135/360  mediastinal]
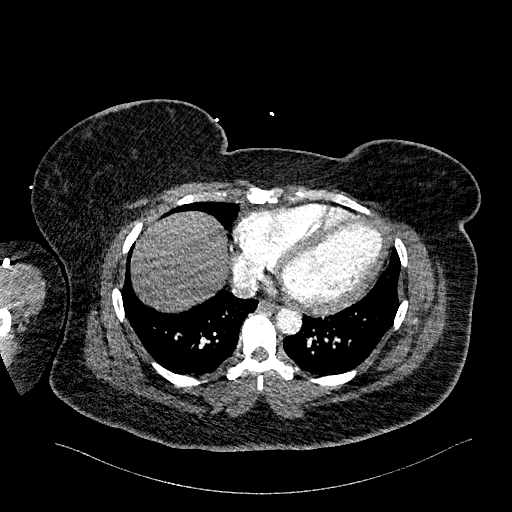
[im 158/360  lung]
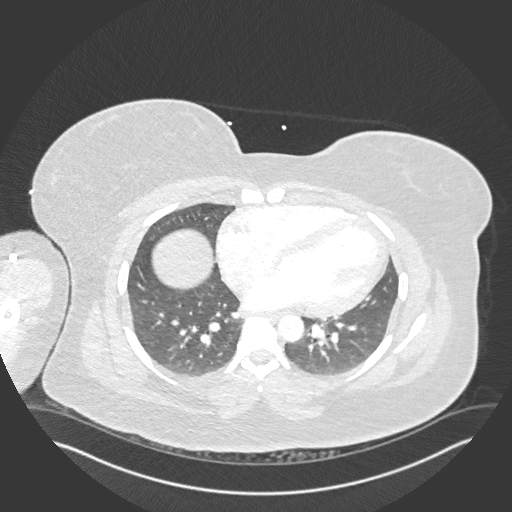
[im 180/360  mediastinal]
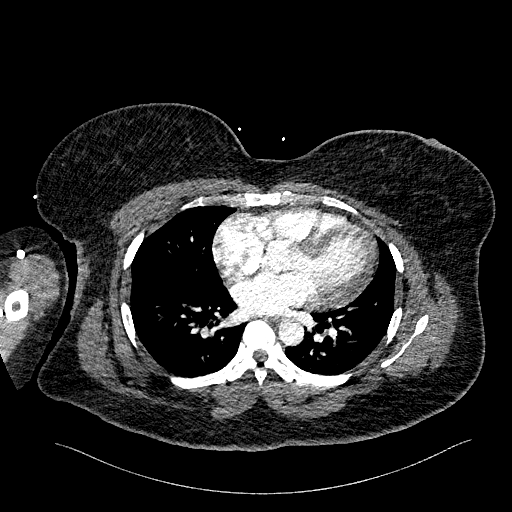
[im 202/360  lung]
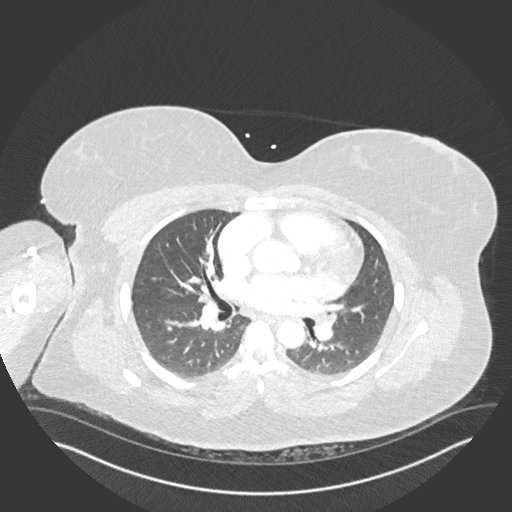
[im 225/360  mediastinal]
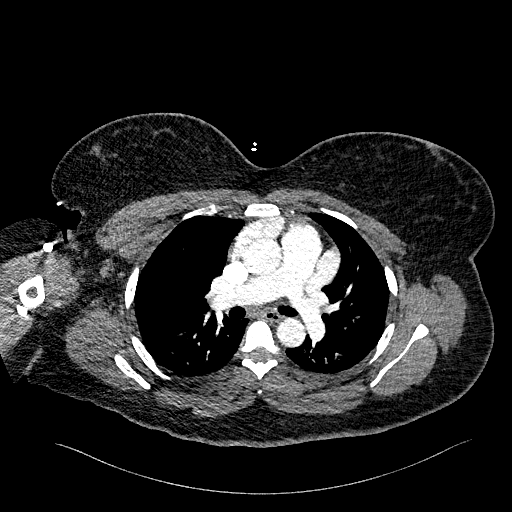
[im 247/360  lung]
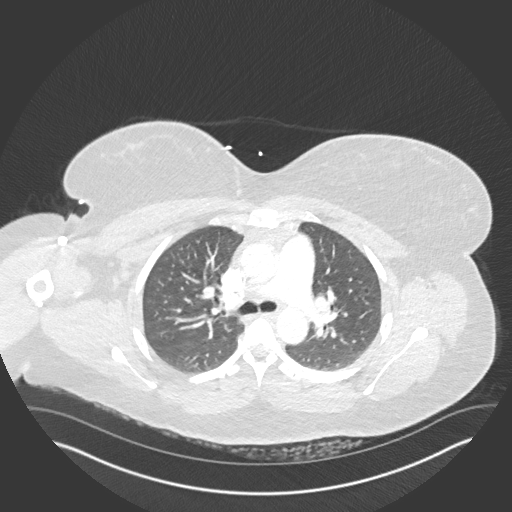
[im 270/360  mediastinal]
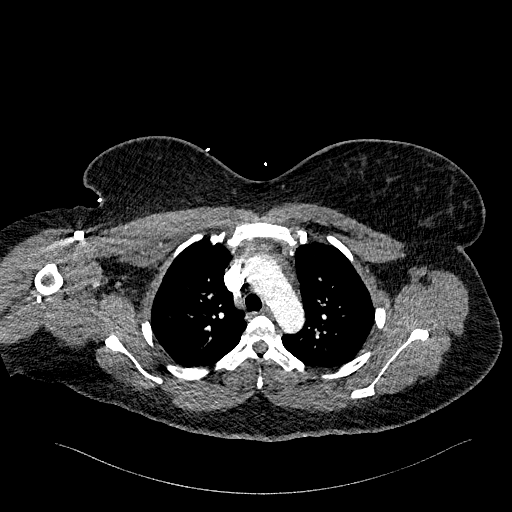
[im 292/360  lung]
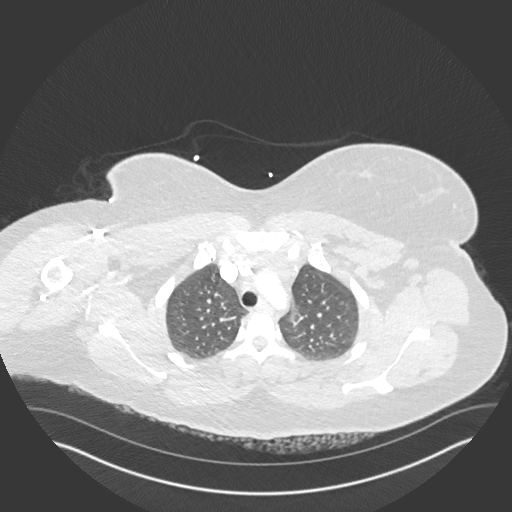
[im 315/360  mediastinal]
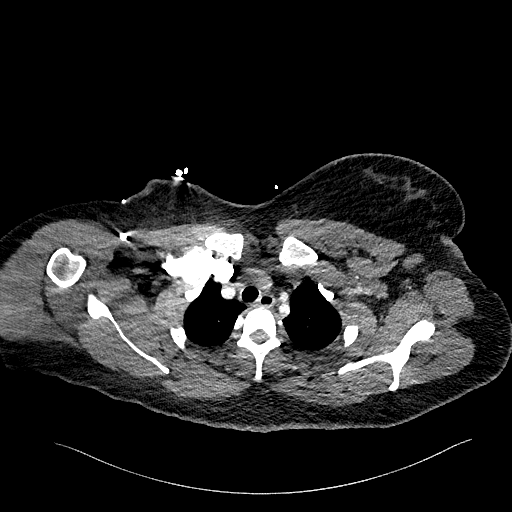
[im 337/360  lung]
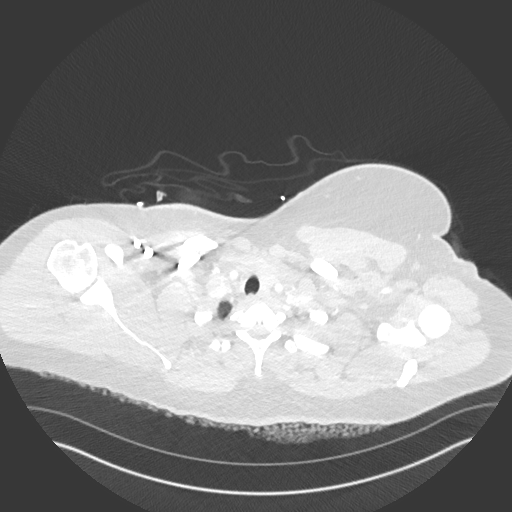

[Series 7: pe 2mm cor · coronal · 0.52mm/px · 1 of 144 slices shown]
[im 72/144  mediastinal]
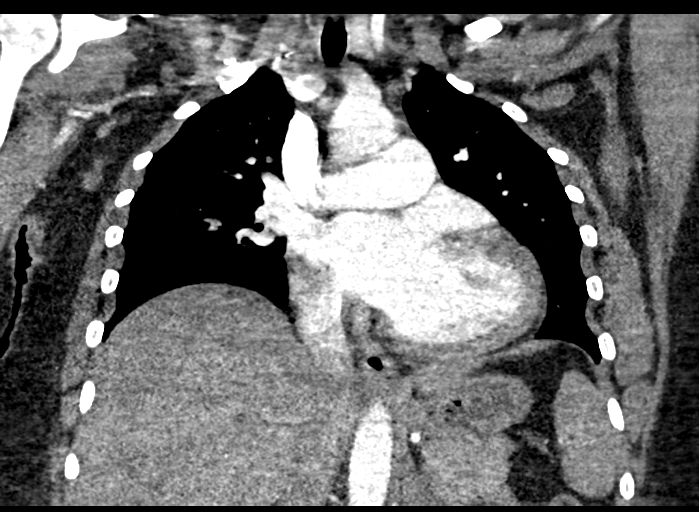

[Series 11: pe lung · axial · 0.90mm/px · z∈[+1259,+1367]mm · 3 of 109 slices shown]
[im 28/109  mediastinal]
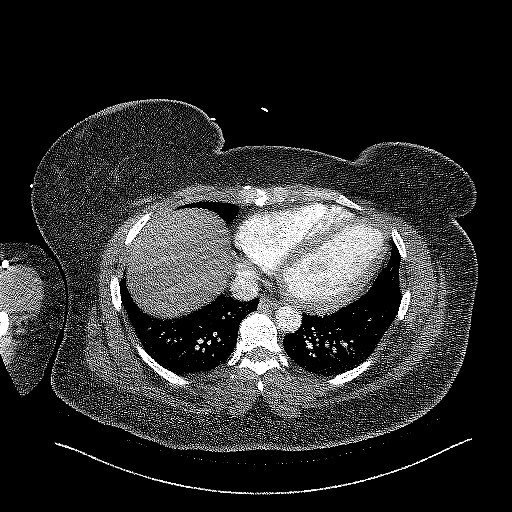
[im 55/109  mediastinal]
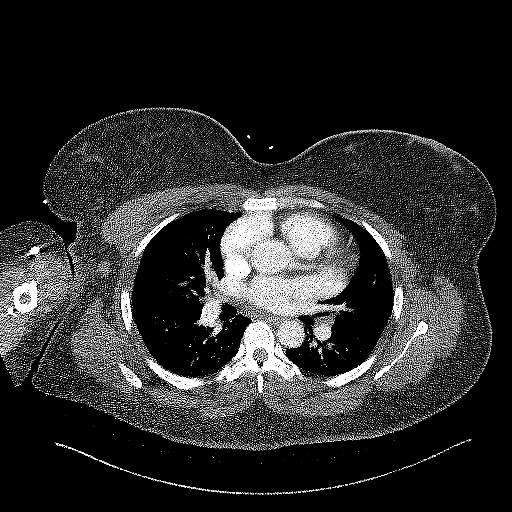
[im 82/109  mediastinal]
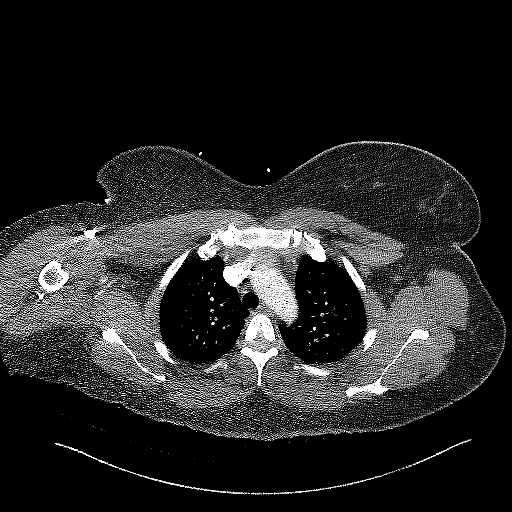

[19 of 36 positions shown; findings below may reference images not displayed]

FINDINGS: Cardiovascular: Adequate contrast bolus timing in the pulmonary
arterial tree. Mild respiratory motion.

No focal filling defect identified in the pulmonary arteries to
suggest acute pulmonary embolism.

Borderline to mild cardiomegaly. No pericardial effusion. Negative
visible aorta. No calcified coronary artery atherosclerosis is
evident.

Mediastinum/Nodes: Negative.  No lymphadenopathy.

Lungs/Pleura: Major airways are patent. There is mild symmetric
atelectasis in both lungs, which otherwise appear negative. No
pleural effusion.

Upper Abdomen: Negative visible liver, spleen, pancreas, left
adrenal gland, left kidney and bowel in the upper abdomen.

Musculoskeletal: Negative.

Review of the MIP images confirms the above findings.
IMPRESSION: Negative for acute pulmonary embolus.

Borderline to mild cardiomegaly.

No acute finding in the chest.

## 2022-05-29 DIAGNOSIS — Z30431 Encounter for routine checking of intrauterine contraceptive device: Secondary | ICD-10-CM | POA: Diagnosis not present

## 2022-06-21 ENCOUNTER — Other Ambulatory Visit: Payer: Self-pay | Admitting: Internal Medicine

## 2022-06-21 DIAGNOSIS — E559 Vitamin D deficiency, unspecified: Secondary | ICD-10-CM | POA: Diagnosis not present

## 2022-06-21 DIAGNOSIS — Z13220 Encounter for screening for lipoid disorders: Secondary | ICD-10-CM | POA: Diagnosis not present

## 2022-06-21 DIAGNOSIS — D6489 Other specified anemias: Secondary | ICD-10-CM | POA: Diagnosis not present

## 2022-06-21 DIAGNOSIS — Z6837 Body mass index (BMI) 37.0-37.9, adult: Secondary | ICD-10-CM | POA: Diagnosis not present

## 2022-06-21 DIAGNOSIS — Z0000 Encounter for general adult medical examination without abnormal findings: Secondary | ICD-10-CM | POA: Diagnosis not present

## 2022-06-21 DIAGNOSIS — I10 Essential (primary) hypertension: Secondary | ICD-10-CM | POA: Diagnosis not present

## 2022-06-21 DIAGNOSIS — Z131 Encounter for screening for diabetes mellitus: Secondary | ICD-10-CM | POA: Diagnosis not present

## 2022-06-21 DIAGNOSIS — E669 Obesity, unspecified: Secondary | ICD-10-CM | POA: Diagnosis not present

## 2022-06-22 LAB — CBC
HCT: 33.3 % — ABNORMAL LOW (ref 35.0–45.0)
Hemoglobin: 10.4 g/dL — ABNORMAL LOW (ref 11.7–15.5)
MCH: 25.9 pg — ABNORMAL LOW (ref 27.0–33.0)
MCHC: 31.2 g/dL — ABNORMAL LOW (ref 32.0–36.0)
MCV: 83 fL (ref 80.0–100.0)
MPV: 11.8 fL (ref 7.5–12.5)
Platelets: 250 10*3/uL (ref 140–400)
RBC: 4.01 10*6/uL (ref 3.80–5.10)
RDW: 14.9 % (ref 11.0–15.0)
WBC: 3.8 10*3/uL (ref 3.8–10.8)

## 2022-06-22 LAB — LIPID PANEL
Cholesterol: 152 mg/dL (ref <200)
HDL: 46 mg/dL — ABNORMAL LOW (ref >=50)
LDL Cholesterol (Calc): 86 mg/dL (calc)
Non-HDL Cholesterol (Calc): 106 mg/dL (calc) (ref <130)
Total CHOL/HDL Ratio: 3.3 (calc) (ref <5.0)
Triglycerides: 107 mg/dL (ref <150)

## 2022-06-22 LAB — COMPLETE METABOLIC PANEL WITH GFR
AG Ratio: 1.4 (calc) (ref 1.0–2.5)
ALT: 11 U/L (ref 6–29)
AST: 11 U/L (ref 10–30)
Albumin: 4.3 g/dL (ref 3.6–5.1)
Alkaline phosphatase (APISO): 55 U/L (ref 31–125)
BUN: 13 mg/dL (ref 7–25)
CO2: 27 mmol/L (ref 20–32)
Calcium: 8.9 mg/dL (ref 8.6–10.2)
Chloride: 103 mmol/L (ref 98–110)
Creat: 0.75 mg/dL (ref 0.50–0.97)
Globulin: 3.1 g/dL (calc) (ref 1.9–3.7)
Glucose, Bld: 78 mg/dL (ref 65–99)
Potassium: 3.4 mmol/L — ABNORMAL LOW (ref 3.5–5.3)
Sodium: 141 mmol/L (ref 135–146)
Total Bilirubin: 0.5 mg/dL (ref 0.2–1.2)
Total Protein: 7.4 g/dL (ref 6.1–8.1)
eGFR: 108 mL/min/{1.73_m2} (ref >=60)

## 2022-06-22 LAB — VITAMIN D 25 HYDROXY (VIT D DEFICIENCY, FRACTURES): Vit D, 25-Hydroxy: 26 ng/mL — ABNORMAL LOW (ref 30–100)

## 2022-06-22 LAB — TSH: TSH: 0.55 mIU/L

## 2022-07-23 ENCOUNTER — Ambulatory Visit
Admission: EM | Admit: 2022-07-23 | Discharge: 2022-07-23 | Disposition: A | Discharge status: Home / Self Care | Payer: BC Managed Care – PPO | Attending: Physician Assistant | Admitting: Physician Assistant

## 2022-07-23 DIAGNOSIS — N3000 Acute cystitis without hematuria: Secondary | ICD-10-CM | POA: Insufficient documentation

## 2022-07-23 LAB — POCT URINALYSIS DIP (MANUAL ENTRY)
Bilirubin, UA: NEGATIVE
Glucose, UA: NEGATIVE mg/dL
Ketones, POC UA: NEGATIVE mg/dL
Nitrite, UA: POSITIVE — AB
Protein Ur, POC: 100 mg/dL — AB
Spec Grav, UA: 1.025 (ref 1.010–1.025)
Urobilinogen, UA: 2 E.U./dL — AB
pH, UA: 6.5 (ref 5.0–8.0)

## 2022-07-23 MED ORDER — NITROFURANTOIN MONOHYD MACRO 100 MG PO CAPS: 100.0000 mg | ORAL_CAPSULE | Freq: Two times a day (BID) | ORAL | 0 refills | Status: AC

## 2022-07-23 NOTE — ED Provider Notes (Signed)
[facility]    CSN: [id number] Arrival date & time: 07/23/22  1152      History   Chief Complaint Chief Complaint  Patient presents with   Urinary Frequency    HPI Laura Mcneil is a 36 y.o. female.   Patient here today for evaluation of urinary frequency and burning she has had for 2 days.  She denies any back pain or abdominal pain.  She has not had fever.  She denies chance of pregnancy as she has ParaGard IUD.  The history is provided by the patient.  Urinary Frequency Pertinent negatives include no abdominal pain.    Past Medical History:  Diagnosis Date   Chronic hypertension affecting pregnancy 04/03/2017   [X]  Aspirin 81 mg daily after 12 weeks; discontinue after 36 weeks Current antihypertensives:  labetalol   Baseline and surveillance labs (pulled in from [facility], refresh links as needed)  Lab Results Component Value Date  PLT 202 04/03/2017  CREATININE 0.76 04/03/2017  AST 15 04/03/2017  ALT 9 04/03/2017 U P:C  79 (04/03/2017)  Antenatal Testing CHTN - O10.919  Group I  BP < 140/90, no preeclampsia, A   Chronic hypertension with superimposed pre-eclampsia 08/08/2017   GBS (group B Streptococcus carrier), +RV culture, currently pregnant 08/11/2017   Hypertension    Pre-eclampsia 08/09/2017    Patient Active Problem List   Diagnosis Date Noted   Obesity (BMI 35.0-39.9 without comorbidity) 06/10/2017    Past Surgical History:  Procedure Laterality Date   NO PAST SURGERIES      OB History     Gravida  2   Para  1   Term      Preterm  1   AB  1   Living  1      SAB      IAB      Ectopic      Multiple  0   Live Births  1            Home Medications    Prior to Admission medications   Medication Sig Start Date End Date Taking? Authorizing Provider  nitrofurantoin, macrocrystal-monohydrate, (MACROBID) 100 MG capsule Take 1 capsule (100 mg total) by mouth 2 (two) times daily. 07/23/22  Yes 07/25/22, PA-C  amLODipine  (NORVASC) 10 MG tablet Take 1 tablet (10 mg total) by mouth daily. 12/27/19   12/29/19, MD  ferrous sulfate 325 (65 FE) MG tablet Take 325 mg by mouth daily with breakfast.    [provider]  ondansetron (ZOFRAN) 4 MG tablet Take 1 tablet (4 mg total) by mouth every 8 (eight) hours as needed for nausea or vomiting. 02/19/22   Lamptey, 02/21/22, MD  potassium chloride SA (KLOR-CON) 20 MEQ tablet Take 1 tablet twice a day for the next 5 days, then continue taking 1 tablet daily 03/01/21   03/03/21, MD    Family History Family History  Problem Relation Age of Onset   Hypertension Mother    Cancer Mother    Heart disease Mother     Social History Social History   Tobacco Use   Smoking status: Never   Smokeless tobacco: Never  Vaping Use   Vaping Use: Never used  Substance Use Topics   Alcohol use: Yes    Comment: occ   Drug use: No     Allergies   Patient has no known allergies.   Review of Systems Review of Systems  Constitutional:  Negative for chills  and fever.  Eyes:  Negative for discharge and redness.  Gastrointestinal:  Negative for abdominal pain, nausea and vomiting.  Genitourinary:  Positive for dysuria and frequency.  Musculoskeletal:  Negative for back pain.     Physical Exam Triage Vital Signs ED Triage Vitals  Enc Vitals Group     BP 07/23/22 1348 135/85     Pulse Rate 07/23/22 1348 71     Resp 07/23/22 1348 18     Temp 07/23/22 1348 98.2 F (36.8 C)     Temp Source 07/23/22 1348 Oral     SpO2 07/23/22 1348 97 %     Weight --      Height --      Head Circumference --      Peak Flow --      Pain Score 07/23/22 1347 1     Pain Loc --      Pain Edu? --      Excl. in GC? --    No data found.  Updated Vital Signs BP 135/85 (BP Location: Right Arm)   Pulse 71   Temp 98.2 F (36.8 C) (Oral)   Resp 18   SpO2 97%   Physical Exam Vitals and nursing note reviewed.  Constitutional:      General: She is not in acute  distress.    Appearance: Normal appearance. She is not ill-appearing.  HENT:     Head: Normocephalic and atraumatic.  Eyes:     Conjunctiva/sclera: Conjunctivae normal.  Cardiovascular:     Rate and Rhythm: Normal rate.  Pulmonary:     Effort: Pulmonary effort is normal.  Neurological:     Mental Status: She is alert.  Psychiatric:        Mood and Affect: Mood normal.        Behavior: Behavior normal.        Thought Content: Thought content normal.      UC Treatments / Results  Labs (all labs ordered are listed, but only abnormal results are displayed) Labs Reviewed  POCT URINALYSIS DIP (MANUAL ENTRY) - Abnormal; Notable for the following components:      Result Value   Color, UA straw (*)    Clarity, UA turbid (*)    Blood, UA large (*)    Protein Ur, POC =100 (*)    Urobilinogen, UA 2.0 (*)    Nitrite, UA Positive (*)    Leukocytes, UA Large (3+) (*)    All other components within normal limits  URINE CULTURE    EKG   Radiology No results found.  Procedures Procedures (including critical care time)  Medications Ordered in UC Medications - No data to display  Initial Impression / Assessment and Plan / UC Course  I have reviewed the triage vital signs and the nursing notes.  Pertinent labs & imaging results that were available during my care of the patient were reviewed by me and considered in my medical decision making (see chart for details).    UA consistent with urinary tract infection.  Will treat with Macrobid and urine culture ordered.  Recommended further evaluation if no gradual improvement or with any further concerns.  Final Clinical Impressions(s) / UC Diagnoses   Final diagnoses:  Acute cystitis without hematuria   Discharge Instructions   None    ED Prescriptions     Medication Sig Dispense Auth. Provider   nitrofurantoin, macrocrystal-monohydrate, (MACROBID) 100 MG capsule Take 1 capsule (100 mg total) by mouth 2 (two) times daily.  10 capsule Tomi Bamberger, PA-C      PDMP not reviewed this encounter.   Tomi Bamberger, PA-C 07/23/22 1820

## 2022-07-23 NOTE — ED Triage Notes (Signed)
Pt presents with urinary frequency X 2 days.  

## 2022-07-25 LAB — URINE CULTURE: Culture: >=100000 — AB

## 2022-08-20 ENCOUNTER — Other Ambulatory Visit: Payer: Self-pay

## 2022-08-20 ENCOUNTER — Emergency Department (HOSPITAL_BASED_OUTPATIENT_CLINIC_OR_DEPARTMENT_OTHER)
Admission: EM | Admit: 2022-08-20 | Discharge: 2022-08-20 | Disposition: A | Discharge status: Home / Self Care | Payer: BC Managed Care – PPO | Attending: Emergency Medicine | Admitting: Emergency Medicine

## 2022-08-20 ENCOUNTER — Encounter (HOSPITAL_BASED_OUTPATIENT_CLINIC_OR_DEPARTMENT_OTHER): Payer: Self-pay | Admitting: Emergency Medicine

## 2022-08-20 DIAGNOSIS — Z79899 Other long term (current) drug therapy: Secondary | ICD-10-CM | POA: Insufficient documentation

## 2022-08-20 DIAGNOSIS — J020 Streptococcal pharyngitis: Secondary | ICD-10-CM | POA: Diagnosis not present

## 2022-08-20 DIAGNOSIS — J029 Acute pharyngitis, unspecified: Secondary | ICD-10-CM | POA: Diagnosis not present

## 2022-08-20 DIAGNOSIS — I10 Essential (primary) hypertension: Secondary | ICD-10-CM | POA: Insufficient documentation

## 2022-08-20 DIAGNOSIS — Z20822 Contact with and (suspected) exposure to covid-19: Secondary | ICD-10-CM | POA: Insufficient documentation

## 2022-08-20 LAB — RESP PANEL BY RT-PCR (RSV, FLU A&B, COVID)  RVPGX2
Influenza A by PCR: NEGATIVE
Influenza B by PCR: NEGATIVE
Resp Syncytial Virus by PCR: NEGATIVE
SARS Coronavirus 2 by RT PCR: NEGATIVE

## 2022-08-20 LAB — GROUP A STREP BY PCR: Group A Strep by PCR: DETECTED — AB

## 2022-08-20 MED ORDER — IBUPROFEN 400 MG PO TABS
600.0000 mg | ORAL_TABLET | Freq: Once | ORAL | Status: AC
  Administered 2022-08-20: 600 mg via ORAL
  Filled 2022-08-20: qty 1

## 2022-08-20 MED ORDER — AMOXICILLIN 500 MG PO CAPS
500.0000 mg | ORAL_CAPSULE | Freq: Once | ORAL | Status: AC
  Administered 2022-08-20: 500 mg via ORAL
  Filled 2022-08-20: qty 1

## 2022-08-20 MED ORDER — AMOXICILLIN 500 MG PO CAPS: 500.0000 mg | ORAL_CAPSULE | Freq: Two times a day (BID) | ORAL | 0 refills | Status: AC

## 2022-08-20 NOTE — ED Triage Notes (Signed)
Pt here from home with c/o sore throat , pt throat is red and painful

## 2022-08-20 NOTE — Discharge Instructions (Addendum)
You are diagnosed with strep throat.  Recommend that you continue taking amoxicillin as an antibiotic that will treat the infection.  If you have inability to swallow or drooling or trouble breathing please return the emergency department for evaluation.  If symptoms persist recommend a follow-up with your PCP.

## 2022-08-20 NOTE — ED Provider Notes (Signed)
[facility] EMERGENCY DEPT Provider Note   CSN: [id number] Arrival date & time: 08/20/22  1341     History  Chief Complaint  Patient presents with   Sore Throat   HPI Laura Mcneil is a 36 y.o. female with hypertension presenting for sore throat.  Started yesterday. Sore throat has been constant and she has had trouble swallowing but still able to drink.  Denies fever.  Has had a cough.  Boyfriend has had similar symptoms.  Denies neck pain.    Sore Throat       Home Medications Prior to Admission medications   Medication Sig Start Date End Date Taking? Authorizing Provider  amoxicillin (AMOXIL) 500 MG capsule Take 1 capsule (500 mg total) by mouth 2 (two) times daily for 10 days. 08/20/22 08/30/22 Yes Gareth Eagle, PA-C  amLODipine (NORVASC) 10 MG tablet Take 1 tablet (10 mg total) by mouth daily. 12/27/19   Elvina Sidle, MD  ferrous sulfate 325 (65 FE) MG tablet Take 325 mg by mouth daily with breakfast.    [provider]  nitrofurantoin, macrocrystal-monohydrate, (MACROBID) 100 MG capsule Take 1 capsule (100 mg total) by mouth 2 (two) times daily. 07/23/22   Tomi Bamberger, PA-C  ondansetron (ZOFRAN) 4 MG tablet Take 1 tablet (4 mg total) by mouth every 8 (eight) hours as needed for nausea or vomiting. 02/19/22   Lamptey, Britta Mccreedy, MD  potassium chloride SA (KLOR-CON) 20 MEQ tablet Take 1 tablet twice a day for the next 5 days, then continue taking 1 tablet daily 03/01/21   Dione Booze, MD      Allergies    Patient has no known allergies.    Review of Systems   Review of Systems  HENT:  Positive for sore throat.     Physical Exam Updated Vital Signs BP (!) 144/101 (BP Location: Right Arm)   Pulse (!) 106   Temp 99.7 F (37.6 C)   Resp 18   SpO2 100%  Physical Exam Constitutional:      Appearance: Normal appearance.  HENT:     Head: Normocephalic.     Nose: Nose normal.     Mouth/Throat:     Pharynx: Uvula midline.  Pharyngeal swelling, oropharyngeal exudate and posterior oropharyngeal erythema present.  Eyes:     Conjunctiva/sclera: Conjunctivae normal.  Pulmonary:     Effort: Pulmonary effort is normal.  Neurological:     Mental Status: She is alert.  Psychiatric:        Mood and Affect: Mood normal.     ED Results / Procedures / Treatments   Labs (all labs ordered are listed, but only abnormal results are displayed) Labs Reviewed  GROUP A STREP BY PCR - Abnormal; Notable for the following components:      Result Value   Group A Strep by PCR DETECTED (*)    All other components within normal limits  RESP PANEL BY RT-PCR (RSV, FLU A&B, COVID)  [id number]    EKG None  Radiology No results found.  Procedures Procedures    Medications Ordered in ED Medications  amoxicillin (AMOXIL) capsule 500 mg (has no administration in time range)  ibuprofen (ADVIL) tablet 600 mg (has no administration in time range)    ED Course/ Medical Decision Making/ A&P                           Medical Decision Making Risk Prescription drug management.   36 year old  well-appearing female who is nontoxic and hemodynamically stable presenting for a sore throat.  Physical exam notable for tonsillar exudate, erythema and swelling in the posterior pharynx.  Differential diagnosis for this complaint includes strep pharyngitis, peritonsillar abscess, retropharyngeal abscess.  Symptoms not consistent with peritonsillar or retropharyngeal abscess.  Symptoms are likely consistent with strep pharyngitis.  Treat with amoxicillin.  Treated pain related to sore throat with ibuprofen.  Sent remaining amoxicillin prescription to her pharmacy.  Advised her to follow-up with her PCP if symptoms persist.        Final Clinical Impression(s) / ED Diagnoses Final diagnoses:  Strep pharyngitis    Rx / DC Orders ED Discharge Orders          Ordered    amoxicillin (AMOXIL) 500 MG capsule  2 times daily        08/20/22  1725              Gareth Eagle, PA-C 08/20/22 1728    Benjiman Core, MD 08/20/22 2325

## 2022-09-20 DIAGNOSIS — D6489 Other specified anemias: Secondary | ICD-10-CM | POA: Diagnosis not present

## 2022-09-20 DIAGNOSIS — Z6837 Body mass index (BMI) 37.0-37.9, adult: Secondary | ICD-10-CM | POA: Diagnosis not present

## 2022-09-20 DIAGNOSIS — I10 Essential (primary) hypertension: Secondary | ICD-10-CM | POA: Diagnosis not present

## 2022-09-20 DIAGNOSIS — Z7189 Other specified counseling: Secondary | ICD-10-CM | POA: Diagnosis not present

## 2022-09-20 DIAGNOSIS — E669 Obesity, unspecified: Secondary | ICD-10-CM | POA: Diagnosis not present

## 2023-01-10 DIAGNOSIS — I10 Essential (primary) hypertension: Secondary | ICD-10-CM | POA: Diagnosis not present

## 2023-01-10 DIAGNOSIS — Z6837 Body mass index (BMI) 37.0-37.9, adult: Secondary | ICD-10-CM | POA: Diagnosis not present

## 2023-01-10 DIAGNOSIS — E669 Obesity, unspecified: Secondary | ICD-10-CM | POA: Diagnosis not present

## 2023-01-10 DIAGNOSIS — S335XXA Sprain of ligaments of lumbar spine, initial encounter: Secondary | ICD-10-CM | POA: Diagnosis not present

## 2023-01-10 DIAGNOSIS — E559 Vitamin D deficiency, unspecified: Secondary | ICD-10-CM | POA: Diagnosis not present

## 2023-01-10 DIAGNOSIS — Z7189 Other specified counseling: Secondary | ICD-10-CM | POA: Diagnosis not present

## 2023-06-13 DIAGNOSIS — Z0000 Encounter for general adult medical examination without abnormal findings: Secondary | ICD-10-CM | POA: Diagnosis not present

## 2023-06-13 DIAGNOSIS — I10 Essential (primary) hypertension: Secondary | ICD-10-CM | POA: Diagnosis not present

## 2023-06-13 DIAGNOSIS — H6122 Impacted cerumen, left ear: Secondary | ICD-10-CM | POA: Diagnosis not present

## 2023-06-13 DIAGNOSIS — Z23 Encounter for immunization: Secondary | ICD-10-CM | POA: Diagnosis not present

## 2023-06-13 DIAGNOSIS — D6489 Other specified anemias: Secondary | ICD-10-CM | POA: Diagnosis not present

## 2023-06-13 DIAGNOSIS — Z131 Encounter for screening for diabetes mellitus: Secondary | ICD-10-CM | POA: Diagnosis not present

## 2023-06-13 DIAGNOSIS — E559 Vitamin D deficiency, unspecified: Secondary | ICD-10-CM | POA: Diagnosis not present

## 2023-06-13 DIAGNOSIS — E669 Obesity, unspecified: Secondary | ICD-10-CM | POA: Diagnosis not present

## 2023-06-13 DIAGNOSIS — Z13220 Encounter for screening for lipoid disorders: Secondary | ICD-10-CM | POA: Diagnosis not present

## 2023-09-12 DIAGNOSIS — N946 Dysmenorrhea, unspecified: Secondary | ICD-10-CM | POA: Diagnosis not present

## 2023-09-12 DIAGNOSIS — D6489 Other specified anemias: Secondary | ICD-10-CM | POA: Diagnosis not present

## 2023-09-12 DIAGNOSIS — I10 Essential (primary) hypertension: Secondary | ICD-10-CM | POA: Diagnosis not present

## 2023-09-12 DIAGNOSIS — E669 Obesity, unspecified: Secondary | ICD-10-CM | POA: Diagnosis not present

## 2023-10-24 DIAGNOSIS — I10 Essential (primary) hypertension: Secondary | ICD-10-CM | POA: Diagnosis not present

## 2023-10-24 DIAGNOSIS — R002 Palpitations: Secondary | ICD-10-CM | POA: Diagnosis not present

## 2023-10-24 DIAGNOSIS — D6489 Other specified anemias: Secondary | ICD-10-CM | POA: Diagnosis not present

## 2023-10-24 DIAGNOSIS — N946 Dysmenorrhea, unspecified: Secondary | ICD-10-CM | POA: Diagnosis not present

## 2023-12-12 ENCOUNTER — Ambulatory Visit
Admission: EM | Admit: 2023-12-12 | Discharge: 2023-12-12 | Disposition: A | Discharge status: Home / Self Care | Attending: Family Medicine | Admitting: Family Medicine

## 2023-12-12 ENCOUNTER — Encounter: Payer: Self-pay | Admitting: *Deleted

## 2023-12-12 ENCOUNTER — Other Ambulatory Visit: Payer: Self-pay

## 2023-12-12 DIAGNOSIS — R059 Cough, unspecified: Secondary | ICD-10-CM | POA: Diagnosis not present

## 2023-12-12 DIAGNOSIS — J029 Acute pharyngitis, unspecified: Secondary | ICD-10-CM | POA: Diagnosis not present

## 2023-12-12 LAB — POCT RAPID STREP A (OFFICE): Rapid Strep A Screen: NEGATIVE

## 2023-12-12 MED ORDER — PREDNISONE 20 MG PO TABS: 20.0000 mg | ORAL_TABLET | Freq: Two times a day (BID) | ORAL | 0 refills | Status: AC

## 2023-12-12 NOTE — ED Triage Notes (Signed)
 Cough and sore throat since Monday. No fever. States starting to have runny nose today. States throat "feels inflamed". She works with children.

## 2023-12-12 NOTE — ED Provider Notes (Signed)
 [facility]    CSN: [id number] Arrival date & time: 12/12/23  1033      History   Chief Complaint Chief Complaint  Patient presents with   Sore Throat    HPI Laura Mcneil is a 38 y.o. female.  Presents today with cough, sore throat, runny nose x 3 days.  She has not had a fever, denies any known sick contacts.  Endorses that her throat feels inflamed and painful with swallowing.  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  She has not taken any medication for symptoms.  No known sick contacts.   Past Medical History:  Diagnosis Date   Chronic hypertension affecting pregnancy 04/03/2017   [X]  Aspirin 81 mg daily after 12 weeks; discontinue after 36 weeks Current antihypertensives:  labetalol   Baseline and surveillance labs (pulled in from [facility], refresh links as needed)  Lab Results Component Value Date  PLT 202 04/03/2017  CREATININE 0.76 04/03/2017  AST 15 04/03/2017  ALT 9 04/03/2017 U P:C  79 (04/03/2017)  Antenatal Testing CHTN - O10.919  Group I  BP < 140/90, no preeclampsia, A   Chronic hypertension with superimposed pre-eclampsia 08/08/2017   GBS (group B Streptococcus carrier), +RV culture, currently pregnant 08/11/2017   Hypertension    Pre-eclampsia 08/09/2017    Patient Active Problem List   Diagnosis Date Noted   Obesity (BMI 35.0-39.9 without comorbidity) 06/10/2017    Past Surgical History:  Procedure Laterality Date   NO PAST SURGERIES      OB History     Gravida  2   Para  1   Term      Preterm  1   AB  1   Living  1      SAB      IAB      Ectopic      Multiple  0   Live Births  1            Home Medications    Prior to Admission medications   Medication Sig Start Date End Date Taking? Authorizing Provider  amLODipine (NORVASC) 10 MG tablet Take 1 tablet (10 mg total) by mouth daily. 12/27/19  Yes Dain Drown, MD  ergocalciferol (VITAMIN D2) 1.25 MG (50000 UT) capsule Take  50,000 Units by mouth once a week.   Yes [provider]  ferrous sulfate 325 (65 FE) MG tablet Take 325 mg by mouth daily with breakfast.   Yes [provider]  potassium chloride SA (KLOR-CON) 20 MEQ tablet Take 1 tablet twice a day for the next 5 days, then continue taking 1 tablet daily 03/01/21  Yes Alissa April, MD  predniSONE (DELTASONE) 20 MG tablet Take 1 tablet (20 mg total) by mouth 2 (two) times daily with a meal for 5 days. 12/12/23 12/17/23 Yes Buena Carmine, NP  nitrofurantoin, macrocrystal-monohydrate, (MACROBID) 100 MG capsule Take 1 capsule (100 mg total) by mouth 2 (two) times daily. Patient not taking: Reported on 12/12/2023 07/23/22   Vernestine Gondola, PA-C  ondansetron (ZOFRAN) 4 MG tablet Take 1 tablet (4 mg total) by mouth every 8 (eight) hours as needed for nausea or vomiting. Patient not taking: Reported on 12/12/2023 02/19/22   Corine Dice, MD    Family History Family History  Problem Relation Age of Onset   Hypertension Mother    Cancer Mother    Heart disease Mother     Social History Social History  Tobacco Use   Smoking status: Never   Smokeless tobacco: Never  Vaping Use   Vaping status: Never Used  Substance Use Topics   Alcohol use: Yes    Comment: occ   Drug use: No     Allergies   Patient has no known allergies.   Review of Systems Review of Systems Pertinent negatives listed in HPI   Physical Exam Triage Vital Signs ED Triage Vitals  Encounter Vitals Group     BP 12/12/23 1145 132/88     Systolic BP Percentile --      Diastolic BP Percentile --      Pulse Rate 12/12/23 1145 73     Resp 12/12/23 1145 18     Temp 12/12/23 1145 98.4 F (36.9 C)     Temp Source 12/12/23 1145 Oral     SpO2 12/12/23 1145 98 %     Weight --      Height --      Head Circumference --      Peak Flow --      Pain Score 12/12/23 1143 8     Pain Loc --      Pain Education --      Exclude from Growth Chart --    No data  found.  Updated Vital Signs BP 132/88 (BP Location: Left Arm)   Pulse 73   Temp 98.4 F (36.9 C) (Oral)   Resp 18   SpO2 98%   Visual Acuity Right Eye Distance:   Left Eye Distance:   Bilateral Distance:    Right Eye Near:   Left Eye Near:    Bilateral Near:     Physical Exam General Appearance:    Alert, cooperative, no distress  HENT:   bilateral anterior cervical nodes enlarged, pharynx erythematous without exudate, tonsils pink, enlarged, withoutexudate present, post nasal drip noted, and nasal mucosa congested  Eyes:    PERRL, conjunctiva/corneas clear, EOM's intact       Lungs:     Clear to auscultation bilaterally, respirations unlabored  Heart:    Regular rate and rhythm  Neurologic:   Awake, alert, oriented x 3. No apparent focal neurological           defect.         UC Treatments / Results  Labs (all labs ordered are listed, but only abnormal results are displayed) Labs Reviewed  POCT RAPID STREP A (OFFICE) - Normal  CULTURE, GROUP A STREP [facility])    EKG   Radiology No results found.  Procedures Procedures (including critical care time)  Medications Ordered in UC Medications - No data to display  Initial Impression / Assessment and Plan / UC Course  I have reviewed the triage vital signs and the nursing notes.  Pertinent labs & imaging results that were available during my care of the patient were reviewed by me and considered in my medical decision making (see chart for details).    Strep is negative.  Throat culture is pending.  Patient has enlarged tonsils with mild erythema involving the throat.  Deferred any treatment with antibiotics however due to discomfort with swallowing and pronounced enlargement of tonsils, will treat with a burst of prednisone 20 mg twice daily x 5 days. Patient aware our office will only reach out if throat culture indicates the need for treatment Final Clinical Impressions(s) / UC Diagnoses   Final diagnoses:  Acute  pharyngitis, unspecified etiology     Discharge Instructions  Throat culture is pending.  For viral pharyngitis.  Start prednisone 20 mg twice daily for 5 days.  If your throat swab grows out any bacterial infection we will contact you by phone and send any necessary prescription to the pharmacy.     ED Prescriptions     Medication Sig Dispense Auth. Provider   predniSONE (DELTASONE) 20 MG tablet Take 1 tablet (20 mg total) by mouth 2 (two) times daily with a meal for 5 days. 10 tablet Buena Carmine, NP      PDMP not reviewed this encounter.   Buena Carmine, NP 12/14/23 1152

## 2023-12-12 NOTE — Discharge Instructions (Addendum)
 Throat culture is pending.  For viral pharyngitis.  Start prednisone 20 mg twice daily for 5 days.  If your throat swab grows out any bacterial infection we will contact you by phone and send any necessary prescription to the pharmacy.

## 2023-12-15 LAB — CULTURE, GROUP A STREP (THRC)

## 2024-01-21 DIAGNOSIS — I10 Essential (primary) hypertension: Secondary | ICD-10-CM | POA: Diagnosis not present

## 2024-01-21 DIAGNOSIS — N946 Dysmenorrhea, unspecified: Secondary | ICD-10-CM | POA: Diagnosis not present

## 2024-01-21 DIAGNOSIS — D6489 Other specified anemias: Secondary | ICD-10-CM | POA: Diagnosis not present

## 2024-01-21 DIAGNOSIS — E669 Obesity, unspecified: Secondary | ICD-10-CM | POA: Diagnosis not present

## 2024-06-25 ENCOUNTER — Other Ambulatory Visit: Payer: Self-pay

## 2024-06-25 ENCOUNTER — Emergency Department (HOSPITAL_BASED_OUTPATIENT_CLINIC_OR_DEPARTMENT_OTHER): Admitting: Radiology

## 2024-06-25 ENCOUNTER — Emergency Department (HOSPITAL_BASED_OUTPATIENT_CLINIC_OR_DEPARTMENT_OTHER)
Admission: EM | Admit: 2024-06-25 | Discharge: 2024-06-25 | Disposition: A | Discharge status: Home / Self Care | Attending: Emergency Medicine | Admitting: Emergency Medicine

## 2024-06-25 ENCOUNTER — Encounter (HOSPITAL_BASED_OUTPATIENT_CLINIC_OR_DEPARTMENT_OTHER): Payer: Self-pay

## 2024-06-25 DIAGNOSIS — Z79899 Other long term (current) drug therapy: Secondary | ICD-10-CM | POA: Diagnosis not present

## 2024-06-25 DIAGNOSIS — R0789 Other chest pain: Secondary | ICD-10-CM | POA: Diagnosis present

## 2024-06-25 DIAGNOSIS — E876 Hypokalemia: Secondary | ICD-10-CM | POA: Diagnosis not present

## 2024-06-25 DIAGNOSIS — I10 Essential (primary) hypertension: Secondary | ICD-10-CM | POA: Diagnosis not present

## 2024-06-25 DIAGNOSIS — D509 Iron deficiency anemia, unspecified: Secondary | ICD-10-CM | POA: Insufficient documentation

## 2024-06-25 LAB — BASIC METABOLIC PANEL WITH GFR
Anion gap: 11 (ref 5–15)
BUN: 10 mg/dL (ref 6–20)
CO2: 29 mmol/L (ref 22–32)
Calcium: 8.9 mg/dL (ref 8.9–10.3)
Chloride: 100 mmol/L (ref 98–111)
Creatinine, Ser: 0.81 mg/dL (ref 0.44–1.00)
GFR, Estimated: >60 mL/min (ref >60)
Glucose, Bld: 93 mg/dL (ref 70–99)
Potassium: 2.7 mmol/L — CL (ref 3.5–5.1)
Sodium: 139 mmol/L (ref 135–145)

## 2024-06-25 LAB — CBC WITH DIFFERENTIAL/PLATELET
Abs Immature Granulocytes: 0.01 K/uL (ref 0.00–0.07)
Basophils Absolute: 0 K/uL (ref 0.0–0.1)
Basophils Relative: 0 %
Eosinophils Absolute: 0 K/uL (ref 0.0–0.5)
Eosinophils Relative: 1 %
HCT: 29.4 % — ABNORMAL LOW (ref 36.0–46.0)
Hemoglobin: 9.2 g/dL — ABNORMAL LOW (ref 12.0–15.0)
Immature Granulocytes: 0 %
Lymphocytes Relative: 26 %
Lymphs Abs: 1.4 K/uL (ref 0.7–4.0)
MCH: 24.1 pg — ABNORMAL LOW (ref 26.0–34.0)
MCHC: 31.3 g/dL (ref 30.0–36.0)
MCV: 77.2 fL — ABNORMAL LOW (ref 80.0–100.0)
Monocytes Absolute: 0.4 K/uL (ref 0.1–1.0)
Monocytes Relative: 8 %
Neutro Abs: 3.4 K/uL (ref 1.7–7.7)
Neutrophils Relative %: 65 %
Platelets: 245 K/uL (ref 150–400)
RBC: 3.81 MIL/uL — ABNORMAL LOW (ref 3.87–5.11)
RDW: 16.3 % — ABNORMAL HIGH (ref 11.5–15.5)
WBC: 5.2 K/uL (ref 4.0–10.5)
nRBC: 0 % (ref 0.0–0.2)

## 2024-06-25 LAB — TROPONIN T, HIGH SENSITIVITY: Troponin T High Sensitivity: <15 ng/L (ref 0–19)

## 2024-06-25 MED ORDER — POTASSIUM CHLORIDE CRYS ER 20 MEQ PO TBCR: EXTENDED_RELEASE_TABLET | ORAL | 0 refills | Status: AC

## 2024-06-25 MED ORDER — POTASSIUM CHLORIDE 10 MEQ/100ML IV SOLN
10.0000 meq | INTRAVENOUS | Status: AC
  Administered 2024-06-25 (×4): 10 meq via INTRAVENOUS
  Filled 2024-06-25 (×4): qty 100

## 2024-06-25 MED ORDER — POTASSIUM CHLORIDE CRYS ER 20 MEQ PO TBCR
40.0000 meq | EXTENDED_RELEASE_TABLET | Freq: Once | ORAL | Status: AC
  Administered 2024-06-25: 40 meq via ORAL
  Filled 2024-06-25: qty 2

## 2024-06-25 NOTE — ED Triage Notes (Signed)
 Pt to ED from home with c/o CP and a headache for the past 3 days. Pt has been taking ibuprofen  with no relief. Arrives A+O, VSS, NADN.

## 2024-06-25 NOTE — ED Notes (Signed)
 Pt given discharge instructions and reviewed prescriptions. Opportunities given for questions. Pt verbalizes understanding. PIV removed x1. Bethena Powell SAUNDERS, RN

## 2024-06-25 NOTE — ED Provider Notes (Signed)
 [place] EMERGENCY DEPARTMENT AT [facility]  Provider Note  CSN: [id number] Arrival date & time: 06/25/24 0206  History Chief Complaint  Patient presents with   Chest Pain   Headache    Laura Mcneil is a 38 y.o. female with history of HTN on amlodipine  reports several days of intermittent chest discomfort, heart fluttering, SOB, and headaches. She has had some nasal congestion and has taken some motrin , but no other OTC medications or decongestants. She has noted her BP has been more elevated recently. No recent travel, leg swelling. No fevers or cough.    Home Medications Prior to Admission medications   Medication Sig Start Date End Date Taking? Authorizing Provider  amLODipine  (NORVASC ) 10 MG tablet Take 1 tablet (10 mg total) by mouth daily. 12/27/19   Mario Million, MD  ergocalciferol  (VITAMIN D2) 1.25 MG (50000 UT) capsule Take 50,000 Units by mouth once a week.    [provider]  ferrous sulfate  325 (65 FE) MG tablet Take 325 mg by mouth daily with breakfast.    [provider]  nitrofurantoin , macrocrystal-monohydrate, (MACROBID ) 100 MG capsule Take 1 capsule (100 mg total) by mouth 2 (two) times daily. Patient not taking: Reported on 12/12/2023 07/23/22   Billy Asberry FALCON, PA-C  ondansetron  (ZOFRAN ) 4 MG tablet Take 1 tablet (4 mg total) by mouth every 8 (eight) hours as needed for nausea or vomiting. Patient not taking: Reported on 12/12/2023 02/19/22   Blaise Aleene KIDD, MD  potassium chloride  SA (KLOR-CON  M) 20 MEQ tablet Take 1 tablet (20 mEq total) by mouth 2 (two) times daily for 5 days, THEN 1 tablet (20 mEq total) daily for 25 days. 06/25/24 07/25/24  Roselyn Carlin NOVAK, MD     Allergies    Patient has no known allergies.   Review of Systems   Review of Systems Please see HPI for pertinent positives and negatives  Physical Exam BP (!) 155/94 (BP Location: Right Arm)   Pulse 86   Temp 98.9 F (37.2 C) (Oral)   Resp 19   Ht  5' 11 (1.803 m)   Wt 117.9 kg   SpO2 99%   BMI 36.26 kg/m   Physical Exam Vitals and nursing note reviewed.  Constitutional:      Appearance: Normal appearance.  HENT:     Head: Normocephalic and atraumatic.     Nose: Nose normal.     Mouth/Throat:     Mouth: Mucous membranes are moist.  Eyes:     Extraocular Movements: Extraocular movements intact.     Conjunctiva/sclera: Conjunctivae normal.  Cardiovascular:     Rate and Rhythm: Normal rate.  Pulmonary:     Effort: Pulmonary effort is normal.     Breath sounds: Normal breath sounds.  Abdominal:     General: Abdomen is flat.     Palpations: Abdomen is soft.     Tenderness: There is no abdominal tenderness.  Musculoskeletal:        General: No swelling. Normal range of motion.     Cervical back: Neck supple.     Right lower leg: No edema.     Left lower leg: No edema.  Skin:    General: Skin is warm and dry.  Neurological:     General: No focal deficit present.     Mental Status: She is alert and oriented to person, place, and time.  Psychiatric:        Mood and Affect: Mood normal.  ED Results / Procedures / Treatments   EKG EKG Interpretation Date/Time:  Thursday June 25 2024 02:16:50 EDT Ventricular Rate:  86 PR Interval:  152 QRS Duration:  92 QT Interval:  368 QTC Calculation: 441 R Axis:   54  Text Interpretation: Sinus rhythm Minimal ST depression, inferior leads No significant change since last tracing Confirmed by Roselyn Dunnings [phone]) on 06/25/2024 2:39:39 AM  Procedures Procedures  Medications Ordered in the ED Medications  potassium chloride  10 mEq in 100 mL IVPB (10 mEq Intravenous New Bag/Given 06/25/24 0405)  potassium chloride  SA (KLOR-CON  M) CR tablet 40 mEq (40 mEq Oral Given 06/25/24 0405)    Initial Impression and Plan  Patient here with intermittent chest discomfort and headaches, low risk for CAD, no concern for PE, could be due to viral illness or HTN. She has been  anemic and hypokalemic in the past (on iron and K supplements at home). Will check labs, CXR and monitor in the ED.   ED Course   Clinical Course as of 06/25/24 0434  Thu Jun 25, 2024  0259 I personally viewed the images from radiology studies and agree with radiologist interpretation: CXR is clear [CS]  0304 CBC with microcytic anemia, consistent with previous. [CS]  0357 BMP with hypokalemia, worse than baseline. Will begin oral and IV repletion.  [CS]  0357 Trop is neg, given atypical symptoms, low risk factor profile and duration of symptoms, a delta is not necessary to rule out AMI.  [CS]    Clinical Course User Index [CS] Roselyn Dunnings NOVAK, MD     MDM Rules/Calculators/A&P Medical Decision Making Problems Addressed: Atypical chest pain: acute illness or injury Hypokalemia: chronic illness or injury Microcytic anemia: chronic illness or injury Uncontrolled hypertension: chronic illness or injury  Amount and/or Complexity of Data Reviewed Labs: ordered. Decision-making details documented in ED Course. Radiology: ordered and independent interpretation performed. Decision-making details documented in ED Course. ECG/medicine tests: ordered and independent interpretation performed. Decision-making details documented in ED Course.  Risk Prescription drug management.     Final Clinical Impression(s) / ED Diagnoses Final diagnoses:  Atypical chest pain  Microcytic anemia  Hypokalemia  Uncontrolled hypertension    Rx / DC Orders ED Discharge Orders          Ordered    potassium chloride  SA (KLOR-CON  M) 20 MEQ tablet  Multiple Frequencies        06/25/24 0433             Roselyn Dunnings NOVAK, MD 06/25/24 [phone]
